# Patient Record
Sex: Male | Born: 1945 | Race: White | Hispanic: No | Marital: Married | Smoking: Never smoker
Health system: Southern US, Community
[De-identification: ages and names within clinical notes are randomized; demographics above are authoritative.]

## PROBLEM LIST (undated history)

## (undated) DIAGNOSIS — I480 Paroxysmal atrial fibrillation: Secondary | ICD-10-CM

## (undated) DIAGNOSIS — E875 Hyperkalemia: Secondary | ICD-10-CM

## (undated) DIAGNOSIS — I5042 Chronic combined systolic (congestive) and diastolic (congestive) heart failure: Secondary | ICD-10-CM

## (undated) DIAGNOSIS — E785 Hyperlipidemia, unspecified: Secondary | ICD-10-CM

## (undated) DIAGNOSIS — I10 Essential (primary) hypertension: Secondary | ICD-10-CM

## (undated) DIAGNOSIS — I700 Atherosclerosis of aorta: Secondary | ICD-10-CM

## (undated) DIAGNOSIS — I440 Atrioventricular block, first degree: Secondary | ICD-10-CM

## (undated) DIAGNOSIS — I499 Cardiac arrhythmia, unspecified: Secondary | ICD-10-CM

## (undated) DIAGNOSIS — I2510 Atherosclerotic heart disease of native coronary artery without angina pectoris: Secondary | ICD-10-CM

## (undated) HISTORY — DX: Hyperlipidemia, unspecified: E78.5

## (undated) HISTORY — DX: Atherosclerotic heart disease of native coronary artery without angina pectoris: I25.10

## (undated) HISTORY — DX: Atherosclerosis of aorta: I70.0

## (undated) HISTORY — DX: Paroxysmal atrial fibrillation: I48.0

## (undated) HISTORY — PX: SHOULDER SURGERY: SHX246

## (undated) HISTORY — DX: Hyperkalemia: E87.5

## (undated) HISTORY — DX: Chronic combined systolic (congestive) and diastolic (congestive) heart failure: I50.42

## (undated) HISTORY — DX: Atrioventricular block, first degree: I44.0

## (undated) HISTORY — DX: Essential (primary) hypertension: I10

---

## 2001-06-09 ENCOUNTER — Ambulatory Visit (HOSPITAL_COMMUNITY): Admission: RE | Admit: 2001-06-09 | Discharge: 2001-06-09 | Payer: Self-pay | Admitting: Internal Medicine

## 2001-06-12 ENCOUNTER — Encounter: Payer: Self-pay | Admitting: Internal Medicine

## 2001-06-12 ENCOUNTER — Ambulatory Visit (HOSPITAL_COMMUNITY): Admission: RE | Admit: 2001-06-12 | Discharge: 2001-06-12 | Payer: Self-pay | Admitting: Internal Medicine

## 2007-10-24 ENCOUNTER — Emergency Department (HOSPITAL_COMMUNITY): Admission: EM | Admit: 2007-10-24 | Discharge: 2007-10-24 | Payer: Self-pay | Admitting: Emergency Medicine

## 2011-03-06 LAB — DIFFERENTIAL
Basophils Absolute: 0
Basophils Relative: 0
Eosinophils Absolute: 0.1
Monocytes Absolute: 0.8
Monocytes Relative: 8
Neutro Abs: 8.8 — ABNORMAL HIGH

## 2011-03-06 LAB — TYPE AND SCREEN

## 2011-03-06 LAB — POCT I-STAT, CHEM 8
BUN: 20
Calcium, Ion: 0.99 — ABNORMAL LOW
Chloride: 109
Creatinine, Ser: 1
Glucose, Bld: 91
TCO2: 18

## 2011-03-06 LAB — CBC
Hemoglobin: 14.4
MCHC: 33.9
MCV: 89.4
RDW: 13.6

## 2011-03-06 LAB — ABO/RH: ABO/RH(D): A POS

## 2019-07-20 ENCOUNTER — Emergency Department (HOSPITAL_COMMUNITY)
Admission: EM | Admit: 2019-07-20 | Discharge: 2019-07-20 | Disposition: A | Discharge status: Home / Self Care | Payer: PRIVATE HEALTH INSURANCE | Attending: Emergency Medicine | Admitting: Emergency Medicine

## 2019-07-20 ENCOUNTER — Emergency Department (HOSPITAL_COMMUNITY): Payer: PRIVATE HEALTH INSURANCE

## 2019-07-20 ENCOUNTER — Encounter (HOSPITAL_COMMUNITY): Payer: Self-pay | Admitting: Emergency Medicine

## 2019-07-20 ENCOUNTER — Emergency Department (HOSPITAL_BASED_OUTPATIENT_CLINIC_OR_DEPARTMENT_OTHER): Payer: PRIVATE HEALTH INSURANCE

## 2019-07-20 ENCOUNTER — Other Ambulatory Visit: Payer: Self-pay

## 2019-07-20 DIAGNOSIS — R2242 Localized swelling, mass and lump, left lower limb: Secondary | ICD-10-CM | POA: Diagnosis not present

## 2019-07-20 DIAGNOSIS — I10 Essential (primary) hypertension: Secondary | ICD-10-CM

## 2019-07-20 DIAGNOSIS — I4891 Unspecified atrial fibrillation: Secondary | ICD-10-CM | POA: Insufficient documentation

## 2019-07-20 DIAGNOSIS — M7989 Other specified soft tissue disorders: Secondary | ICD-10-CM | POA: Diagnosis not present

## 2019-07-20 DIAGNOSIS — R000 Tachycardia, unspecified: Secondary | ICD-10-CM | POA: Diagnosis present

## 2019-07-20 LAB — BASIC METABOLIC PANEL
Anion gap: 10 (ref 5–15)
BUN: 16 mg/dL (ref 8–23)
CO2: 25 mmol/L (ref 22–32)
Calcium: 9.7 mg/dL (ref 8.9–10.3)
Chloride: 105 mmol/L (ref 98–111)
Creatinine, Ser: 0.94 mg/dL (ref 0.61–1.24)
GFR calc Af Amer: >60 mL/min (ref >60)
GFR calc non Af Amer: >60 mL/min (ref >60)
Glucose, Bld: 99 mg/dL (ref 70–99)
Potassium: 4.1 mmol/L (ref 3.5–5.1)
Sodium: 140 mmol/L (ref 135–145)

## 2019-07-20 LAB — CBC
HCT: 46.8 % (ref 39.0–52.0)
Hemoglobin: 15.3 g/dL (ref 13.0–17.0)
MCH: 30.8 pg (ref 26.0–34.0)
MCHC: 32.7 g/dL (ref 30.0–36.0)
MCV: 94.4 fL (ref 80.0–100.0)
Platelets: 130 10*3/uL — ABNORMAL LOW (ref 150–400)
RBC: 4.96 MIL/uL (ref 4.22–5.81)
RDW: 13.4 % (ref 11.5–15.5)
WBC: 6.7 10*3/uL (ref 4.0–10.5)
nRBC: 0 % (ref 0.0–0.2)

## 2019-07-20 LAB — TSH: TSH: 2.115 u[IU]/mL (ref 0.350–4.500)

## 2019-07-20 LAB — CBG MONITORING, ED: Glucose-Capillary: 89 mg/dL (ref 70–99)

## 2019-07-20 LAB — D-DIMER, QUANTITATIVE: D-Dimer, Quant: 1.09 ug/mL-FEU — ABNORMAL HIGH (ref 0.00–0.50)

## 2019-07-20 MED ORDER — METOPROLOL SUCCINATE ER 50 MG PO TB24: 50.0000 mg | ORAL_TABLET | Freq: Every day | ORAL | 0 refills | Status: DC

## 2019-07-20 MED ORDER — SODIUM CHLORIDE 0.9 % IV BOLUS
500.0000 mL | Freq: Once | INTRAVENOUS | Status: AC
  Administered 2019-07-20: 16:00:00 500 mL via INTRAVENOUS

## 2019-07-20 MED ORDER — APIXABAN 5 MG PO TABS
5.0000 mg | ORAL_TABLET | Freq: Once | ORAL | Status: AC
  Administered 2019-07-20: 20:00:00 5 mg via ORAL
  Filled 2019-07-20: qty 1

## 2019-07-20 MED ORDER — IOHEXOL 350 MG/ML SOLN
100.0000 mL | Freq: Once | INTRAVENOUS | Status: AC | PRN
  Administered 2019-07-20: 18:00:00 100 mL via INTRAVENOUS

## 2019-07-20 MED ORDER — METOPROLOL TARTRATE 25 MG PO TABS
25.0000 mg | ORAL_TABLET | Freq: Once | ORAL | Status: AC
  Administered 2019-07-20: 20:00:00 25 mg via ORAL
  Filled 2019-07-20: qty 1

## 2019-07-20 MED ORDER — APIXABAN 5 MG PO TABS: 5.0000 mg | ORAL_TABLET | Freq: Two times a day (BID) | ORAL | 0 refills | Status: DC

## 2019-07-20 NOTE — Discharge Instructions (Addendum)
Please begin taking Eliquis twice daily, this is a blood thinner to help reduce your risk of stroke with A. fib.  While taking this medication you are at an increased risk of bleeding.  Please read the instructions provided about this medication.  You were found to be in A. fib, this is the cause for your elevated heart rates, please begin taking metoprolol 50 mg once daily in the morning to help control your heart rates.  You will need to follow-up in the A. fib clinic in the next few weeks for reevaluation of your A. fib.  Please follow-up with your new primary care doctor as well for continued evaluation of your pressure.

## 2019-07-20 NOTE — Progress Notes (Addendum)
Left lower extremity venous duplex complete. Left lower extremity appears negative for DVT. Technical delay with study images from [place], preliminary report given to Jodi Geralds, [place] @ 17:45.  Levin Bacon- RDMS, RVT 6:13 PM  07/20/2019

## 2019-07-20 NOTE — ED Triage Notes (Signed)
Patient sent over from his primary care, first visit was today, primary care wanted him to be evaluated for his HTN, tachycardia and his blood sugar. Patent denies any complaints.

## 2019-07-20 NOTE — ED Notes (Signed)
Son of Gornick would like to be called with updates at [phone].

## 2019-07-20 NOTE — ED Provider Notes (Signed)
[facility]-EMERGENCY DEPT Provider Note   CSN: [id number] Arrival date & time: 07/20/19  1305     History Chief Complaint  Patient presents with  . Hypertension    Joshua Mejia is a 74 y.o. male.  Joshua Mejia is a 74 y.o. male with no known past medical history aside from back pain, who presents to the emergency department for evaluation of hypertension, tachycardia and possible diabetes.  Patient states that he has not seen a primary care doctor in about 30 years but a few months ago he went to an eye doctor and they noticed some spots in his eyes concerning for either hypertension or diabetes and referred him to a PCP.  He had his first appointment with a primary care doctor in [place] [facility] today and was sent to the ED for further evaluation of tachycardia, and hypertension.  He denied knowing that he had high blood pressure.  He has not had any chest pain or shortness of breath.  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  He denies any abdominal pain or swelling.  He did note some swelling in his left leg today while he was at the doctor's office but he reports this is not painful.  He denies any sensation of palpitations or heart racing.  He has not had any headaches, vision changes, numbness tingling or weakness.  Denies any fevers or recent illness.  He is unsure what his heart rate is usually.  Denies alcohol or substance abuse.        History reviewed. No pertinent past medical history.  There are no problems to display for this patient.   History reviewed. No pertinent surgical history.     No family history on file.  Social History   Tobacco Use  . Smoking status: Not on file  Substance Use Topics  . Alcohol use: Not on file  . Drug use: Not on file    Home Medications Prior to Admission medications   Not on File    Allergies    Patient has no allergy information on record.  Review  of Systems   Review of Systems  Constitutional: Negative for chills and fever.  HENT: Negative.   Eyes: Negative for visual disturbance.  Respiratory: Negative for cough and shortness of breath.   Cardiovascular: Positive for leg swelling. Negative for chest pain and palpitations.  Gastrointestinal: Negative for abdominal pain, nausea and vomiting.  Genitourinary: Negative for dysuria.  Musculoskeletal: Negative for arthralgias and myalgias.  Neurological: Negative for dizziness, syncope, weakness, light-headedness, numbness and headaches.  All other systems reviewed and are negative.   Physical Exam Updated Vital Signs BP (!) 172/101 (BP Location: Right Arm)   Pulse (!) 122   Temp 98.1 F (36.7 C) (Oral)   Resp 18   Ht 5\' 10"  (1.778 m)   Wt 102.5 kg   SpO2 98%   BMI 32.43 kg/m   Physical Exam Vitals and nursing note reviewed.  Constitutional:      General: He is not in acute distress.    Appearance: Normal appearance. He is well-developed and normal weight. He is not ill-appearing or diaphoretic.     Comments: Well-appearing elderly gentleman in no distress  HENT:     Head: Normocephalic and atraumatic.     Mouth/Throat:     Mouth: Mucous membranes are moist.     Pharynx: Oropharynx is clear.  Eyes:  General:        Right eye: No discharge.        Left eye: No discharge.     Extraocular Movements: Extraocular movements intact.     Pupils: Pupils are equal, round, and reactive to light.  Cardiovascular:     Rate and Rhythm: Tachycardia present. Rhythm irregular.     Heart sounds: Normal heart sounds. No murmur. No friction rub. No gallop.   Pulmonary:     Effort: Pulmonary effort is normal. No respiratory distress.     Breath sounds: Normal breath sounds. No wheezing or rales.     Comments: Respirations equal and unlabored, patient able to speak in full sentences, lungs clear to auscultation bilaterally Abdominal:     General: Bowel sounds are normal. There is  no distension.     Palpations: Abdomen is soft. There is no mass.     Tenderness: There is no abdominal tenderness. There is no guarding.     Comments: Abdomen soft, nondistended, nontender to palpation in all quadrants without guarding or peritoneal signs  Musculoskeletal:        General: No deformity.     Cervical back: Neck supple.     Comments: Trace edema noted in the left lower extremity, no calf tenderness noted  Skin:    General: Skin is warm and dry.     Capillary Refill: Capillary refill takes less than 2 seconds.  Neurological:     Mental Status: He is alert.     Coordination: Coordination normal.     Comments: Speech is clear, able to follow commands CN III-XII grossly intact Normal strength in upper and lower extremities bilaterally including dorsiflexion and plantar flexion, strong and equal grip strength Sensation normal to light and sharp touch Moves extremities without ataxia, coordination intact  Psychiatric:        Mood and Affect: Mood normal.        Behavior: Behavior normal.     ED Results / Procedures / Treatments   Labs (all labs ordered are listed, but only abnormal results are displayed) Labs Reviewed  CBC - Abnormal; Notable for the following components:      Result Value   Platelets 130 (*)    All other components within normal limits  D-DIMER, QUANTITATIVE (NOT AT [facility]) - Abnormal; Notable for the following components:   D-Dimer, Quant 1.09 (*)    All other components within normal limits  BASIC METABOLIC PANEL  TSH  CBG MONITORING, ED    EKG EKG Interpretation  Date/Time:  Tuesday July 20 2019 15:19:54 EST Ventricular Rate:  107 PR Interval:    QRS Duration: 106 QT Interval:  349 QTC Calculation: 466 R Axis:   -30 Text Interpretation: Atrial fibrillation Ventricular premature complex Abnormal R-wave progression, late transition Left ventricular hypertrophy Borderline T abnormalities, lateral leads Baseline wander in lead(s) V3 No   prior ECG for comparison. No STEMI Confirmed by Theda Belfast ([id number]) on 07/20/2019 5:08:49 PM   Radiology CT Angio Chest PE W and/or Wo Contrast  Result Date: 07/20/2019 CLINICAL DATA:  Hypertension, tachycardia EXAM: CT ANGIOGRAPHY CHEST WITH CONTRAST TECHNIQUE: Multidetector CT imaging of the chest was performed using the standard protocol during bolus administration of intravenous contrast. Multiplanar CT image reconstructions and MIPs were obtained to evaluate the vascular anatomy. CONTRAST:  OMNIPAQUE IOHEXOL 350 MG/ML SOLN COMPARISON:  10/24/2018 FINDINGS: Cardiovascular: This is a technically adequate evaluation of the pulmonary vasculature. There are no filling defects or pulmonary emboli. The heart  is enlarged without pericardial effusion. Prominent atherosclerosis is seen within the coronary vessels, greatest in the LAD distribution. There is mild atherosclerosis of the thoracic aorta. No evidence of aneurysm. Mediastinum/Nodes: There are borderline enlarged mediastinal and hilar lymph nodes. Largest lymph node in the right paratracheal region image 41 measures 16 mm in short axis. Lungs/Pleura: There is a trace right pleural effusion. No airspace disease or pneumothorax. The central airways are patent. Upper Abdomen: Hypodensity right lobe liver measures 2.2 cm reference image 107, likely a cyst. The remainder of the upper abdomen is unremarkable. Musculoskeletal: There are no acute displaced fractures. Subacute to chronic left posterior tenth and eleventh rib fractures are identified. Reconstructed images demonstrate no additional findings. Review of the MIP images confirms the above findings. IMPRESSION: 1. No evidence of pulmonary embolus. 2. Trace right pleural effusion. 3. Borderline enlarged mediastinal lymph nodes, nonspecific. 4. Cardiomegaly. 5. Aortic Atherosclerosis (ICD10-I70.0). Electronically Signed   By: Sharlet Salina M.D.   On: 07/20/2019 18:17    Procedures Procedures  (including critical care time)  Medications Ordered in ED Medications  sodium chloride 0.9 % bolus 500 mL (0 mLs Intravenous Stopped 07/20/19 1908)  iohexol (OMNIPAQUE) 350 MG/ML injection 100 mL (100 mLs Intravenous Contrast Given 07/20/19 1751)  apixaban (ELIQUIS) tablet 5 mg (5 mg Oral Given 07/20/19 2011)  metoprolol tartrate (LOPRESSOR) tablet 25 mg (25 mg Oral Given 07/20/19 2011)    ED Course  I have reviewed the triage vital signs and the nursing notes.  Pertinent labs & imaging results that were available during my care of the patient were reviewed by me and considered in my medical decision making (see chart for details).    MDM Rules/Calculators/A&P                     74 year old male arrives from new PCP appointment for evaluation of tachycardia and hypertension, patient also questions whether he may have diabetes due to recent abnormalities found on eye exam, but has not had blood sugar checked.  On arrival he is a heart rate of 122, hypertensive at 172/101, although appears to be asymptomatic.  He denies any chest pain, shortness of breath, palpitations, headaches, vision changes, numbness or weakness.  He does have trace edema of the left lower extremity which is nontender.  Will check basic labs, CBG, EKG, and given unexplained tachycardia will check D-dimer and TSH.  EKG from PCP office reviewed and shows tachycardia with rates in the 120s, multiple PVCs making it difficult to determine rhythm.  Lab work here shows no leukocytosis, normal hemoglobin, no electrolyte derangements and normal creatinine.  Normal TSH.  CBG is not elevated, no concern for diabetes today.  Patient's D-dimer is elevated at 1.09 will proceed with CTA of the test as well as left lower extremity DVT study.  In reviewing patient's EKG here it appears that patient may be in A. fib, telemetry monitoring looks like intermittent episodes of A. fib with heart rates into the 120s here.  Some improvement with 500 cc of  fluid but on reevaluation patient maintains tachycardic in the 110s.  Suspect new onset A. fib may be the cause of patient's tachycardia.  DVT study and PE study are negative.  Some cardiomegaly noted on chest CT.  No prior echo available.  It appears new onset A. fib is the most likely cause for patient's tachycardia, given that he is asymptomatic and well-appearing will discuss with cardiology for medication recommendations in hopes of discharging the  patient home, which would be his preference.  I discussed case with Dr. Marlou Porch with cardiology who agrees the patient is appropriate for discharge, he has a CHA2DS2-VASc score of 2, and will need anticoagulation in the setting of A. fib.  He has good kidney function, Dr. Marlou Porch recommends starting patient on Eliquis 5 mg twice daily, pharmacy will provide education for patient on this medication and he will be given his first dose tonight.  Dr. Marlou Porch also recommends starting him on 50 mg of metoprolol ER daily for control and this may have some benefit on the patient's blood pressure as well.  We will hold off on starting patient on any other blood pressure medications until we see how he tolerates the metoprolol.  Ambulatory referral placed for the A. fib clinic.  Dr. Marlou Porch recommends follow-up appointment in about 3 weeks to further discuss A. fib, may consider cardioversion.  We will also have patient follow closely with new PCP for further blood pressure management.  I discussed this plan with the patient and he is in agreement.  Gave instructions regarding new medications and discussed cautions with blood thinners.  Patient expressed stating in agreement with plan.  Given small dose of metoprolol and first dose of Eliquis here in the ED tonight.  Discharged home in good condition.   Final Clinical Impression(s) / ED Diagnoses Final diagnoses:  New onset atrial fibrillation ([place])  Hypertension, unspecified type    Rx / DC Orders ED Discharge  Orders         Ordered    Amb referral to AFIB Clinic     07/20/19 1913    apixaban (ELIQUIS) 5 MG TABS tablet  2 times daily     07/20/19 1919    metoprolol succinate (TOPROL XL) 50 MG 24 hr tablet  Daily     07/20/19 1919           Janet Berlin 07/20/19 2207    Tegeler, Gwenyth Allegra, MD 07/22/19 [phone]

## 2019-07-21 ENCOUNTER — Telehealth (HOSPITAL_COMMUNITY): Payer: Self-pay | Admitting: Nurse Practitioner

## 2019-07-21 NOTE — Telephone Encounter (Signed)
Referral from [facility] for pt to be seen in A-Fib clinic.  Called patient to schedule appt, no answer or VM.  Per Lupita Leash, pt needs to be scheduled for approx 2 wks so he can be scheduled for possible cardioversion in approx 3 wks.  Will try to call patient back.

## 2019-07-27 NOTE — Telephone Encounter (Signed)
Pt has appt 08/04/19 at A-Fib clinic

## 2019-08-04 ENCOUNTER — Ambulatory Visit (HOSPITAL_COMMUNITY)
Admission: RE | Admit: 2019-08-04 | Discharge: 2019-08-04 | Disposition: A | Discharge status: Home / Self Care | Payer: PRIVATE HEALTH INSURANCE | Source: Ambulatory Visit | Attending: Nurse Practitioner | Admitting: Nurse Practitioner

## 2019-08-04 ENCOUNTER — Other Ambulatory Visit: Payer: Self-pay

## 2019-08-04 VITALS — BP 166/86 | HR 119 | Ht 70.0 in | Wt 222.0 lb

## 2019-08-04 DIAGNOSIS — Z79899 Other long term (current) drug therapy: Secondary | ICD-10-CM | POA: Diagnosis not present

## 2019-08-04 DIAGNOSIS — D6869 Other thrombophilia: Secondary | ICD-10-CM

## 2019-08-04 DIAGNOSIS — Z7901 Long term (current) use of anticoagulants: Secondary | ICD-10-CM | POA: Diagnosis not present

## 2019-08-04 DIAGNOSIS — I10 Essential (primary) hypertension: Secondary | ICD-10-CM | POA: Diagnosis not present

## 2019-08-04 DIAGNOSIS — I4891 Unspecified atrial fibrillation: Secondary | ICD-10-CM | POA: Diagnosis not present

## 2019-08-04 DIAGNOSIS — I4819 Other persistent atrial fibrillation: Secondary | ICD-10-CM | POA: Diagnosis not present

## 2019-08-04 MED ORDER — METOPROLOL SUCCINATE ER 50 MG PO TB24: 50.0000 mg | ORAL_TABLET | Freq: Two times a day (BID) | ORAL | 1 refills | Status: DC

## 2019-08-04 NOTE — Patient Instructions (Signed)
Increase metoprolol to 50mg twice a day 

## 2019-08-04 NOTE — Progress Notes (Addendum)
Primary Care Physician: Christa See, FNP Referring Physician: [facility] ER f/u    Joshua Mejia is a 74 y.o. male with nl known medical history but had not been see by a MD in 72 years in f/u in afib clinic for newly dx afib. Initially  was seen by opthalmologic physician and noted some  spots in his eyes concerning for HTN or DM. He was advised to get established with a PCP. At that visit, he was found to have tachycardia from  which pt was asymptomatic and was sent to the ER. The  EKG confirmed afib. ER MD spoke to Dr. Gillian Shields who suggested BB/DOAC and f/u here.CHA2DS2VASc score of 2.  In the clinic today, pt has been faithful to take eliquis 5 mg bid, stats no missed doses . Has been on since 07/20/19. He continues with RVR despite 50 mg metoprolol qd. He  is still  asymptomatic. Denies any exertional dyspnea, fatigue or fluid weight gain. There  is no way  to determine onset of afib.  He did  have asymptomatic Covid-19 in August. 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   He lives alone. Denies snoring. 2 cups of coffee this am. Drinks 2 glasses of wine or 2 beers every night. Does not smoke.  Today, he denies symptoms of palpitations, chest pain, shortness of breath, orthopnea, PND, lower extremity edema, dizziness, presyncope, syncope, or neurologic sequela. The patient is tolerating medications without difficulties and is otherwise without complaint today.   No past medical history on file. No past surgical history on file.  Current Outpatient Medications  Medication Sig Dispense Refill  . apixaban (ELIQUIS) 5 MG TABS tablet Take 1 tablet (5 mg total) by mouth 2 (two) times daily. 60 tablet 0  . metoprolol succinate (TOPROL XL) 50 MG 24 hr tablet Take 1 tablet (50 mg total) by mouth daily. Take with or immediately following a meal. 30 tablet 0   No current facility-administered medications for this encounter.    Not on File  Social History   Socioeconomic History  .  Marital status: Married    Spouse name: Not on file  . Number of children: Not on file  . Years of education: Not on file  . Highest education level: Not on file  Occupational History  . Not on file  Tobacco Use  . Smoking status: Not on file  Substance and Sexual Activity  . Alcohol use: Not on file  . Drug use: Not on file  . Sexual activity: Not on file  Other Topics Concern  . Not on file  Social History Narrative  . Not on file   Social Determinants of Health   Financial Resource Strain:   . Difficulty of Paying Living Expenses: Not on file  Food Insecurity:   . Worried About Charity fundraiser in the Last Year: Not on file  . Ran Out of Food in the Last Year: Not on file  Transportation Needs:   . Lack of Transportation (Medical): Not on file  . Lack of Transportation (Non-Medical): Not on file  Physical Activity:   . Days of Exercise per Week: Not on file  . Minutes of Exercise per Session: Not on file  Stress:   . Feeling of Stress : Not on file  Social Connections:   . Frequency of Communication with Friends and Family: Not on file  . Frequency of Social Gatherings with Friends and Family: Not on file  .  Attends Religious Services: Not on file  . Active Member of Clubs or Organizations: Not on file  . Attends Banker Meetings: Not on file  . Marital Status: Not on file  Intimate Partner Violence:   . Fear of Current or Ex-Partner: Not on file  . Emotionally Abused: Not on file  . Physically Abused: Not on file  . Sexually Abused: Not on file    No family history on file.  ROS- All systems are reviewed and negative except as per the HPI above  Physical Exam: Vitals:   08/04/19 0834  BP: (!) 166/86  Pulse: (!) 119  Weight: 100.7 kg  Height: 5\' 10"  (1.778 m)   Wt Readings from Last 3 Encounters:  08/04/19 100.7 kg  07/20/19 102.5 kg    Labs: Lab Results  Component Value Date   NA 140 07/20/2019   K 4.1 07/20/2019   CL 105  07/20/2019   CO2 25 07/20/2019   GLUCOSE 99 07/20/2019   BUN 16 07/20/2019   CREATININE 0.94 07/20/2019   CALCIUM 9.7 07/20/2019   No results found for: INR No results found for: CHOL, HDL, LDLCALC, TRIG   GEN- The patient is well appearing, alert and oriented x 3 today.   Head- normocephalic, atraumatic Eyes-  Sclera clear, conjunctiva pink Ears- hearing intact Oropharynx- clear Neck- supple, no JVP Lymph- no cervical lymphadenopathy Lungs- Clear to ausculation bilaterally, normal work of breathing Heart- irregular rate and rhythm, no murmurs, rubs or gallops, PMI not laterally displaced GI- soft, NT, ND, + BS Extremities- no clubbing, cyanosis, or edema MS- no significant deformity or atrophy Skin- no rash or lesion Psych- euthymic mood, full affect Neuro- strength and sensation are intact  EKG-afib at 119 bpm,     Assessment and Plan: 1. Newly dx afib Onset of afib unknown Asymptomatic  General education re afib Increase metoprolol ER to 50 mg bid Recommend no more than 2 alcoholic drinks a week   2. HTN Elevated  Extra metoprolol hopefully will help  PCP is also following   3. CHA2DS2VASc of 2 Continue eliquis 5 mg bid without missed doses   Will see back in one week for nurse visit with ekg and BP  to see if better rate controlled If so will schedule  Echo Will also get baseline fasting  liver/lipid as Dr. 09/17/2019 also suggested to start cholesterol med   Anticipate setting up for cardioversion after 3 weeks on anticoagulation and later sending to Dr. Anne Fu to get establsihed   Anne Fu C. Lupita Leash [facility] [address] [place], [place] [phone]

## 2019-08-11 ENCOUNTER — Other Ambulatory Visit: Payer: Self-pay

## 2019-08-11 ENCOUNTER — Ambulatory Visit (HOSPITAL_COMMUNITY)
Admission: RE | Admit: 2019-08-11 | Discharge: 2019-08-11 | Disposition: A | Discharge status: Home / Self Care | Payer: PRIVATE HEALTH INSURANCE | Source: Ambulatory Visit | Attending: Nurse Practitioner | Admitting: Nurse Practitioner

## 2019-08-11 DIAGNOSIS — Z8616 Personal history of COVID-19: Secondary | ICD-10-CM | POA: Diagnosis not present

## 2019-08-11 DIAGNOSIS — I10 Essential (primary) hypertension: Secondary | ICD-10-CM | POA: Insufficient documentation

## 2019-08-11 DIAGNOSIS — Z79899 Other long term (current) drug therapy: Secondary | ICD-10-CM | POA: Diagnosis not present

## 2019-08-11 DIAGNOSIS — Z7901 Long term (current) use of anticoagulants: Secondary | ICD-10-CM | POA: Insufficient documentation

## 2019-08-11 DIAGNOSIS — I4891 Unspecified atrial fibrillation: Secondary | ICD-10-CM | POA: Diagnosis present

## 2019-08-11 LAB — CBC
HCT: 47.4 % (ref 39.0–52.0)
Hemoglobin: 15.8 g/dL (ref 13.0–17.0)
MCH: 31 pg (ref 26.0–34.0)
MCHC: 33.3 g/dL (ref 30.0–36.0)
MCV: 93.1 fL (ref 80.0–100.0)
Platelets: 180 10*3/uL (ref 150–400)
RBC: 5.09 MIL/uL (ref 4.22–5.81)
RDW: 12.8 % (ref 11.5–15.5)
WBC: 7 10*3/uL (ref 4.0–10.5)
nRBC: 0 % (ref 0.0–0.2)

## 2019-08-11 LAB — COMPREHENSIVE METABOLIC PANEL
ALT: 27 U/L (ref 0–44)
AST: 26 U/L (ref 15–41)
Albumin: 4 g/dL (ref 3.5–5.0)
Alkaline Phosphatase: 63 U/L (ref 38–126)
Anion gap: 9 (ref 5–15)
BUN: 25 mg/dL — ABNORMAL HIGH (ref 8–23)
CO2: 25 mmol/L (ref 22–32)
Calcium: 9.9 mg/dL (ref 8.9–10.3)
Chloride: 104 mmol/L (ref 98–111)
Creatinine, Ser: 1.01 mg/dL (ref 0.61–1.24)
GFR calc Af Amer: >60 mL/min (ref >60)
GFR calc non Af Amer: >60 mL/min (ref >60)
Glucose, Bld: 111 mg/dL — ABNORMAL HIGH (ref 70–99)
Potassium: 5.8 mmol/L — ABNORMAL HIGH (ref 3.5–5.1)
Sodium: 138 mmol/L (ref 135–145)
Total Bilirubin: 1.2 mg/dL (ref 0.3–1.2)
Total Protein: 7.4 g/dL (ref 6.5–8.1)

## 2019-08-11 LAB — LIPID PANEL
Cholesterol: 214 mg/dL — ABNORMAL HIGH (ref 0–200)
HDL: 61 mg/dL (ref >40)
LDL Cholesterol: 145 mg/dL — ABNORMAL HIGH (ref 0–99)
Total CHOL/HDL Ratio: 3.5 RATIO
Triglycerides: 38 mg/dL (ref <150)
VLDL: 8 mg/dL (ref 0–40)

## 2019-08-11 NOTE — Progress Notes (Signed)
Primary Care Physician: Christa See, FNP Referring Physician: [facility] ER f/u    Joshua Mejia is a 74 y.o. male with nl known medical history but had not been see by a MD in 33 years in f/u in afib clinic for newly dx afib. Initially  was seen by opthalmologic physician and noted some  spots in his eyes concerning for HTN or DM. He was advised to get established with a PCP. At that visit, he was found to have tachycardia from  which pt was asymptomatic and was sent to the ER. The  EKG confirmed afib. ER MD spoke to Dr. Gillian Shields who suggested BB/DOAC and f/u here.CHA2DS2VASc score of 2.  In the clinic today, pt has been faithful to take eliquis 5 mg bid, states no missed doses . Has been on since 07/20/19. He continues with RVR despite 50 mg metoprolol qd. He  is still  asymptomatic. Denies any exertional dyspnea, fatigue or fluid weight gain. There  is no way  to determine onset of afib.  He did  have asymptomatic Covid-19 in August. 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   He lives alone. Denies snoring. 2 cups of coffee this am. Drinks 2 glasses of wine or 2 beers every night. Does not smoke.  F/u in afib clinic, 08/11/19. He has not missed any doses of anticoagulation and has been on  sufficient amount of time to scheduled for cardioversion. Increase of BB on last visit helped rate control and BP is improved but not optimal.   Today, he denies symptoms of palpitations, chest pain, shortness of breath, orthopnea, PND, lower extremity edema, dizziness, presyncope, syncope, or neurologic sequela. The patient is tolerating medications without difficulties and is otherwise without complaint today.   No past medical history on file. No past surgical history on file.  Current Outpatient Medications  Medication Sig Dispense Refill  . apixaban (ELIQUIS) 5 MG TABS tablet Take 1 tablet (5 mg total) by mouth 2 (two) times daily. 60 tablet 0  . metoprolol succinate (TOPROL XL) 50 MG 24 hr  tablet Take 1 tablet (50 mg total) by mouth 2 (two) times daily. Take with or immediately following a meal. 60 tablet 1   No current facility-administered medications for this encounter.    Not on File  Social History   Socioeconomic History  . Marital status: Married    Spouse name: Not on file  . Number of children: Not on file  . Years of education: Not on file  . Highest education level: Not on file  Occupational History  . Not on file  Tobacco Use  . Smoking status: Not on file  Substance and Sexual Activity  . Alcohol use: Not on file  . Drug use: Not on file  . Sexual activity: Not on file  Other Topics Concern  . Not on file  Social History Narrative  . Not on file   Social Determinants of Health   Financial Resource Strain:   . Difficulty of Paying Living Expenses: Not on file  Food Insecurity:   . Worried About Charity fundraiser in the Last Year: Not on file  . Ran Out of Food in the Last Year: Not on file  Transportation Needs:   . Lack of Transportation (Medical): Not on file  . Lack of Transportation (Non-Medical): Not on file  Physical Activity:   . Days of Exercise per Week: Not on file  . Minutes of  Exercise per Session: Not on file  Stress:   . Feeling of Stress : Not on file  Social Connections:   . Frequency of Communication with Friends and Family: Not on file  . Frequency of Social Gatherings with Friends and Family: Not on file  . Attends Religious Services: Not on file  . Active Member of Clubs or Organizations: Not on file  . Attends Banker Meetings: Not on file  . Marital Status: Not on file  Intimate Partner Violence:   . Fear of Current or Ex-Partner: Not on file  . Emotionally Abused: Not on file  . Physically Abused: Not on file  . Sexually Abused: Not on file    No family history on file.  ROS- All systems are reviewed and negative except as per the HPI above  Physical Exam: Vitals:   08/11/19 0907  BP: (!)  158/74  Pulse: 98   Wt Readings from Last 3 Encounters:  08/04/19 100.7 kg  07/20/19 102.5 kg    Labs: Lab Results  Component Value Date   NA 140 07/20/2019   K 4.1 07/20/2019   CL 105 07/20/2019   CO2 25 07/20/2019   GLUCOSE 99 07/20/2019   BUN 16 07/20/2019   CREATININE 0.94 07/20/2019   CALCIUM 9.7 07/20/2019   No results found for: INR No results found for: CHOL, HDL, LDLCALC, TRIG   GEN- The patient is well appearing, alert and oriented x 3 today.   Head- normocephalic, atraumatic Eyes-  Sclera clear, conjunctiva pink Ears- hearing intact Oropharynx- clear Neck- supple, no JVP Lymph- no cervical lymphadenopathy Lungs- Clear to ausculation bilaterally, normal work of breathing Heart- irregular rate and rhythm, no murmurs, rubs or gallops, PMI not laterally displaced GI- soft, NT, ND, + BS Extremities- no clubbing, cyanosis, or edema MS- no significant deformity or atrophy Skin- no rash or lesion Psych- euthymic mood, full affect Neuro- strength and sensation are intact  EKG-afib at 98 bpm, qrs int 104 ms, qtc 434 ms   Assessment and Plan: 1. Newly dx afib Onset of afib unknown Asymptomatic  Will set up for cardioversion, on anticoagulation since 2/9 Continue  metoprolol ER  50 mg bid Recommend no more than 2 alcoholic drinks a week  Fasting cmet/cbc/lipid panel today in anticipation of starting statin per Dr. Anne Fu reecommendation  2. HTN Improved but not optimal For now continue BB bid   3. CHA2DS2VASc of 2 Continue eliquis 5 mg bid, adamant no missed doses   Will se back in one week after cardioversion and then order echo hopefully  in SR  Kelsee Preslar C[facility] [address] [place], [place] [address] [phone]

## 2019-08-11 NOTE — H&P (View-Only) (Signed)
 Primary Care Physician: Yonjof, Mary, FNP Referring Physician: [facility] ER f/u    Joshua Mejia is a 74 y.o. male with nl known medical history but had not been see by a MD in 30 years in f/u in afib clinic for newly dx afib. Initially  was seen by opthalmologic physician and noted some  spots in his eyes concerning for HTN or DM. He was advised to get established with a PCP. At that visit, he was found to have tachycardia from  which pt was asymptomatic and was sent to the ER. The  EKG confirmed afib. ER MD spoke to Dr. Skain who suggested BB/DOAC and f/u here.CHA2DS2VASc score of 2.  In the clinic today, pt has been faithful to take eliquis 5 mg bid, states no missed doses . Has been on since 07/20/19. He continues with RVR despite 50 mg metoprolol qd. He  is still  asymptomatic. Denies any exertional dyspnea, fatigue or fluid weight gain. There  is no way  to determine onset of afib.  He did  have asymptomatic Covid-19 in August. 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   He lives alone. Denies snoring. 2 cups of coffee this am. Drinks 2 glasses of wine or 2 beers every night. Does not smoke.  F/u in afib clinic, 08/11/19. He has not missed any doses of anticoagulation and has been on  sufficient amount of time to scheduled for cardioversion. Increase of BB on last visit helped rate control and BP is improved but not optimal.   Today, he denies symptoms of palpitations, chest pain, shortness of breath, orthopnea, PND, lower extremity edema, dizziness, presyncope, syncope, or neurologic sequela. The patient is tolerating medications without difficulties and is otherwise without complaint today.   No past medical history on file. No past surgical history on file.  Current Outpatient Medications  Medication Sig Dispense Refill  . apixaban (ELIQUIS) 5 MG TABS tablet Take 1 tablet (5 mg total) by mouth 2 (two) times daily. 60 tablet 0  . metoprolol succinate (TOPROL XL) 50 MG 24 hr  tablet Take 1 tablet (50 mg total) by mouth 2 (two) times daily. Take with or immediately following a meal. 60 tablet 1   No current facility-administered medications for this encounter.    Not on File  Social History   Socioeconomic History  . Marital status: Married    Spouse name: Not on file  . Number of children: Not on file  . Years of education: Not on file  . Highest education level: Not on file  Occupational History  . Not on file  Tobacco Use  . Smoking status: Not on file  Substance and Sexual Activity  . Alcohol use: Not on file  . Drug use: Not on file  . Sexual activity: Not on file  Other Topics Concern  . Not on file  Social History Narrative  . Not on file   Social Determinants of Health   Financial Resource Strain:   . Difficulty of Paying Living Expenses: Not on file  Food Insecurity:   . Worried About Running Out of Food in the Last Year: Not on file  . Ran Out of Food in the Last Year: Not on file  Transportation Needs:   . Lack of Transportation (Medical): Not on file  . Lack of Transportation (Non-Medical): Not on file  Physical Activity:   . Days of Exercise per Week: Not on file  . Minutes of   Exercise per Session: Not on file  Stress:   . Feeling of Stress : Not on file  Social Connections:   . Frequency of Communication with Friends and Family: Not on file  . Frequency of Social Gatherings with Friends and Family: Not on file  . Attends Religious Services: Not on file  . Active Member of Clubs or Organizations: Not on file  . Attends Banker Meetings: Not on file  . Marital Status: Not on file  Intimate Partner Violence:   . Fear of Current or Ex-Partner: Not on file  . Emotionally Abused: Not on file  . Physically Abused: Not on file  . Sexually Abused: Not on file    No family history on file.  ROS- All systems are reviewed and negative except as per the HPI above  Physical Exam: Vitals:   08/11/19 0907  BP: (!)  158/74  Pulse: 98   Wt Readings from Last 3 Encounters:  08/04/19 100.7 kg  07/20/19 102.5 kg    Labs: Lab Results  Component Value Date   NA 140 07/20/2019   K 4.1 07/20/2019   CL 105 07/20/2019   CO2 25 07/20/2019   GLUCOSE 99 07/20/2019   BUN 16 07/20/2019   CREATININE 0.94 07/20/2019   CALCIUM 9.7 07/20/2019   No results found for: INR No results found for: CHOL, HDL, LDLCALC, TRIG   GEN- The patient is well appearing, alert and oriented x 3 today.   Head- normocephalic, atraumatic Eyes-  Sclera clear, conjunctiva pink Ears- hearing intact Oropharynx- clear Neck- supple, no JVP Lymph- no cervical lymphadenopathy Lungs- Clear to ausculation bilaterally, normal work of breathing Heart- irregular rate and rhythm, no murmurs, rubs or gallops, PMI not laterally displaced GI- soft, NT, ND, + BS Extremities- no clubbing, cyanosis, or edema MS- no significant deformity or atrophy Skin- no rash or lesion Psych- euthymic mood, full affect Neuro- strength and sensation are intact  EKG-afib at 98 bpm, qrs int 104 ms, qtc 434 ms   Assessment and Plan: 1. Newly dx afib Onset of afib unknown Asymptomatic  Will set up for cardioversion, on anticoagulation since 2/9 Continue  metoprolol ER  50 mg bid Recommend no more than 2 alcoholic drinks a week  Fasting cmet/cbc/lipid panel today in anticipation of starting statin per Dr. Anne Fu reecommendation  2. HTN Improved but not optimal For now continue BB bid   3. CHA2DS2VASc of 2 Continue eliquis 5 mg bid, adamant no missed doses   Will se back in one week after cardioversion and then order echo hopefully  in [place]  Avriel Kandel C[facility] [address] [place], [place] [address] [phone]

## 2019-08-11 NOTE — Patient Instructions (Signed)
Cardioversion scheduled for Thursday, March 11th  - Arrive at the [facility] and go to admitting at [facility] not eat or drink anything after midnight the night prior to your procedure.  - Take all your morning medication with a sip of water prior to arrival.  - You will not be able to drive home after your procedure.

## 2019-08-13 ENCOUNTER — Other Ambulatory Visit (HOSPITAL_COMMUNITY): Payer: Self-pay | Admitting: *Deleted

## 2019-08-13 MED ORDER — ROSUVASTATIN CALCIUM 10 MG PO TABS: 10.0000 mg | ORAL_TABLET | Freq: Every day | ORAL | 6 refills | Status: DC

## 2019-08-17 ENCOUNTER — Other Ambulatory Visit (HOSPITAL_COMMUNITY)
Admission: RE | Admit: 2019-08-17 | Discharge: 2019-08-17 | Disposition: A | Discharge status: Home / Self Care | Payer: PRIVATE HEALTH INSURANCE | Source: Ambulatory Visit | Attending: Cardiovascular Disease | Admitting: Cardiovascular Disease

## 2019-08-17 DIAGNOSIS — Z20822 Contact with and (suspected) exposure to covid-19: Secondary | ICD-10-CM | POA: Insufficient documentation

## 2019-08-17 DIAGNOSIS — Z01812 Encounter for preprocedural laboratory examination: Secondary | ICD-10-CM | POA: Diagnosis present

## 2019-08-17 LAB — SARS CORONAVIRUS 2 (TAT 6-24 HRS): SARS Coronavirus 2: NEGATIVE

## 2019-08-19 ENCOUNTER — Encounter (HOSPITAL_COMMUNITY): Payer: Self-pay | Admitting: Cardiovascular Disease

## 2019-08-19 ENCOUNTER — Other Ambulatory Visit: Payer: Self-pay

## 2019-08-19 ENCOUNTER — Encounter (HOSPITAL_COMMUNITY)
Admission: RE | Disposition: A | Discharge status: Home / Self Care | Payer: Self-pay | Source: Home / Self Care | Attending: Cardiovascular Disease

## 2019-08-19 ENCOUNTER — Ambulatory Visit (HOSPITAL_COMMUNITY)
Admission: RE | Admit: 2019-08-19 | Discharge: 2019-08-19 | Disposition: A | Discharge status: Home / Self Care | Payer: PRIVATE HEALTH INSURANCE | Attending: Cardiovascular Disease | Admitting: Cardiovascular Disease

## 2019-08-19 ENCOUNTER — Ambulatory Visit (HOSPITAL_COMMUNITY): Payer: PRIVATE HEALTH INSURANCE | Admitting: Certified Registered"

## 2019-08-19 DIAGNOSIS — Z79899 Other long term (current) drug therapy: Secondary | ICD-10-CM | POA: Insufficient documentation

## 2019-08-19 DIAGNOSIS — I4891 Unspecified atrial fibrillation: Secondary | ICD-10-CM

## 2019-08-19 DIAGNOSIS — Z7901 Long term (current) use of anticoagulants: Secondary | ICD-10-CM | POA: Diagnosis not present

## 2019-08-19 DIAGNOSIS — I10 Essential (primary) hypertension: Secondary | ICD-10-CM | POA: Insufficient documentation

## 2019-08-19 DIAGNOSIS — Z8616 Personal history of COVID-19: Secondary | ICD-10-CM | POA: Diagnosis not present

## 2019-08-19 HISTORY — PX: CARDIOVERSION: SHX1299

## 2019-08-19 HISTORY — DX: Cardiac arrhythmia, unspecified: I49.9

## 2019-08-19 LAB — POCT I-STAT, CHEM 8
BUN: 26 mg/dL — ABNORMAL HIGH (ref 8–23)
Calcium, Ion: 1.21 mmol/L (ref 1.15–1.40)
Chloride: 106 mmol/L (ref 98–111)
Creatinine, Ser: 0.9 mg/dL (ref 0.61–1.24)
Glucose, Bld: 95 mg/dL (ref 70–99)
HCT: 47 % (ref 39.0–52.0)
Hemoglobin: 16 g/dL (ref 13.0–17.0)
Potassium: 4.9 mmol/L (ref 3.5–5.1)
Sodium: 141 mmol/L (ref 135–145)
TCO2: 30 mmol/L (ref 22–32)

## 2019-08-19 SURGERY — CARDIOVERSION
Anesthesia: General

## 2019-08-19 MED ORDER — PROPOFOL 10 MG/ML IV BOLUS
INTRAVENOUS | Status: DC | PRN
  Administered 2019-08-19: 80 mg via INTRAVENOUS

## 2019-08-19 MED ORDER — LIDOCAINE 2% (20 MG/ML) 5 ML SYRINGE
INTRAMUSCULAR | Status: DC | PRN
  Administered 2019-08-19: 60 mg via INTRAVENOUS

## 2019-08-19 MED ORDER — SODIUM CHLORIDE 0.9 % IV SOLN: INTRAVENOUS | Status: DC | PRN

## 2019-08-19 NOTE — Discharge Instructions (Signed)
Electrical Cardioversion Electrical cardioversion is the delivery of a jolt of electricity to restore a normal rhythm to the heart. A rhythm that is too fast or is not regular keeps the heart from pumping well. In this procedure, sticky patches or metal paddles are placed on the chest to deliver electricity to the heart from a device. This procedure may be done in an emergency if:  There is low or no blood pressure as a result of the heart rhythm.  Normal rhythm must be restored as fast as possible to protect the brain and heart from further damage.  It may save a life. This may also be a scheduled procedure for irregular or fast heart rhythms that are not immediately life-threatening. Tell a health care provider about:  Any allergies you have.  All medicines you are taking, including vitamins, herbs, eye drops, creams, and over-the-counter medicines.  Any problems you or family members have had with anesthetic medicines.  Any blood disorders you have.  Any surgeries you have had.  Any medical conditions you have.  Whether you are pregnant or may be pregnant. What are the risks? Generally, this is a safe procedure. However, problems may occur, including:  Allergic reactions to medicines.  A blood clot that breaks free and travels to other parts of your body.  The possible return of an abnormal heart rhythm within hours or days after the procedure.  Your heart stopping (cardiac arrest). This is rare. What happens before the procedure? Medicines  Your health care provider may have you start taking: ? Blood-thinning medicines (anticoagulants) so your blood does not clot as easily. ? Medicines to help stabilize your heart rate and rhythm.  Ask your health care provider about: ? Changing or stopping your regular medicines. This is especially important if you are taking diabetes medicines or blood thinners. ? Taking medicines such as aspirin and ibuprofen. These medicines can  thin your blood. Do not take these medicines unless your health care provider tells you to take them. ? Taking over-the-counter medicines, vitamins, herbs, and supplements. General instructions  Follow instructions from your health care provider about eating or drinking restrictions.  Plan to have someone take you home from the hospital or clinic.  If you will be going home right after the procedure, plan to have someone with you for 24 hours.  Ask your health care provider what steps will be taken to help prevent infection. These may include washing your skin with a germ-killing soap. What happens during the procedure?   An IV will be inserted into one of your veins.  Sticky patches (electrodes) or metal paddles may be placed on your chest.  You will be given a medicine to help you relax (sedative).  An electrical shock will be delivered. The procedure may vary among health care providers and hospitals. What can I expect after the procedure?  Your blood pressure, heart rate, breathing rate, and blood oxygen level will be monitored until you leave the hospital or clinic.  Your heart rhythm will be watched to make sure it does not change.  You may have some redness on the skin where the shocks were given. Follow these instructions at home:  Do not drive for 24 hours if you were given a sedative during your procedure.  Take over-the-counter and prescription medicines only as told by your health care provider.  Ask your health care provider how to check your pulse. Check it often.  Rest for 48 hours after the procedure or   as told by your health care provider.  Avoid or limit your caffeine use as told by your health care provider.  Keep all follow-up visits as told by your health care provider. This is important. Contact a health care provider if:  You feel like your heart is beating too quickly or your pulse is not regular.  You have a serious muscle cramp that does not go  away. Get help right away if:  You have discomfort in your chest.  You are dizzy or you feel faint.  You have trouble breathing or you are short of breath.  Your speech is slurred.  You have trouble moving an arm or leg on one side of your body.  Your fingers or toes turn cold or blue. Summary  Electrical cardioversion is the delivery of a jolt of electricity to restore a normal rhythm to the heart.  This procedure may be done right away in an emergency or may be a scheduled procedure if the condition is not an emergency.  Generally, this is a safe procedure.  After the procedure, check your pulse often as told by your health care provider. This information is not intended to replace advice given to you by your health care provider. Make sure you discuss any questions you have with your health care provider. Document Revised: 12/28/2018 Document Reviewed: 12/28/2018 Elsevier Patient Education  2020 [employer]  

## 2019-08-19 NOTE — Anesthesia Procedure Notes (Signed)
Procedure Name: General with mask airway Date/Time: 08/19/2019 9:39 AM Performed by: Elliot Dally, CRNA Pre-anesthesia Checklist: Patient being monitored, Patient identified, Emergency Drugs available, Suction available and Timeout performed Patient Re-evaluated:Patient Re-evaluated prior to induction Oxygen Delivery Method: Ambu bag

## 2019-08-19 NOTE — Interval H&P Note (Signed)
History and Physical Interval Note:  08/19/2019 9:35 AM  Joshua Mejia  has presented today for surgery, with the diagnosis of AFIB.  The various methods of treatment have been discussed with the patient and family. After consideration of risks, benefits and other options for treatment, the patient has consented to  Procedure(s): CARDIOVERSION (N/A) as a surgical intervention.  The patient's history has been reviewed, patient examined, no change in status, stable for surgery.  I have reviewed the patient's chart and labs.  Questions were answered to the patient's satisfaction.    Symptomatic Afib. Outpatient labs show potassium 5.8. Will repeat and then proceed if normal. On eliquis for >3 weeks and no missed doses.   Gerri Spore T. Flora Lipps, MD [facility]  [address], [address] [place], [place] [address] [phone]  9:36 AM

## 2019-08-19 NOTE — Anesthesia Preprocedure Evaluation (Signed)
Anesthesia Evaluation  Patient identified by MRN, date of birth, ID band Patient awake    Reviewed: Allergy & Precautions, NPO status , Patient's Chart, lab work & pertinent test results  Airway Mallampati: II  TM Distance: >3 FB Neck ROM: Full    Dental no notable dental hx. (+) Teeth Intact, Dental Advisory Given   Pulmonary    Pulmonary exam normal breath sounds clear to auscultation       Cardiovascular Normal cardiovascular exam+ dysrhythmias Atrial Fibrillation  Rhythm:Irregular Rate:Abnormal     Neuro/Psych    GI/Hepatic Neg liver ROS,   Endo/Other  negative endocrine ROS  Renal/GU negative Renal ROS     Musculoskeletal   Abdominal   Peds  Hematology negative hematology ROS (+)   Anesthesia Other Findings   Reproductive/Obstetrics                             Anesthesia Physical Anesthesia Plan  ASA: II  Anesthesia Plan: General   Post-op Pain Management:    Induction: Intravenous  PONV Risk Score and Plan: Treatment may vary due to age or medical condition  Airway Management Planned: Nasal Cannula and Natural Airway  Additional Equipment:   Intra-op Plan:   Post-operative Plan:   Informed Consent: I have reviewed the patients History and Physical, chart, labs and discussed the procedure including the risks, benefits and alternatives for the proposed anesthesia with the patient or authorized representative who has indicated his/her understanding and acceptance.     Dental advisory given  Plan Discussed with:   Anesthesia Plan Comments:         Anesthesia Quick Evaluation

## 2019-08-19 NOTE — Transfer of Care (Signed)
Immediate Anesthesia Transfer of Care Note  Patient: Joshua Mejia  Procedure(s) Performed: CARDIOVERSION (N/A )  Patient Location: Endoscopy Unit  Anesthesia Type:General  Level of Consciousness: drowsy  Airway & Oxygen Therapy: Patient Spontanous Breathing  Post-op Assessment: Report given to RN and Post -op Vital signs reviewed and stable  Post vital signs: Reviewed and stable  Last Vitals:  Vitals Value Taken Time  BP    Temp    Pulse    Resp    SpO2      Last Pain:  Vitals:   08/19/19 0912  PainSc: 0-No pain         Complications: No apparent anesthesia complications

## 2019-08-19 NOTE — CV Procedure (Signed)
   DIRECT CURRENT CARDIOVERSION  NAME:  Joshua Mejia    MRN: [id number] DOB:  Oct 23, 1945    ADMIT DATE: 08/19/2019  Indication:  Symptomatic atrial fibrillation  Procedure Note:  The patient signed informed consent.  They have had had therapeutic anticoagulation with eliquis greater than 3 weeks.  Anesthesia was administered by Dr. Richardson Landry.  Adequate airway was maintained throughout and vital followed per protocol.  They were cardioverted x 1 with 200J of biphasic synchronized energy.  They converted to NSR.  There were no apparent complications.  The patient had normal neuro status and respiratory status post procedure with vitals stable as recorded elsewhere.    Follow up: They will continue on current medical therapy and follow up with cardiology as scheduled.  Gerri Spore T. Flora Lipps, MD [facility]  [address], [address] [place], [place] [address] [phone]  9:51 AM

## 2019-08-19 NOTE — Anesthesia Postprocedure Evaluation (Signed)
Anesthesia Post Note  Patient: Joshua Mejia  Procedure(s) Performed: CARDIOVERSION (N/A )     Patient location during evaluation: Endoscopy Anesthesia Type: General Level of consciousness: awake and alert Pain management: pain level controlled Vital Signs Assessment: post-procedure vital signs reviewed and stable Respiratory status: spontaneous breathing, nonlabored ventilation, respiratory function stable and patient connected to nasal cannula oxygen Cardiovascular status: blood pressure returned to baseline and stable Postop Assessment: no apparent nausea or vomiting Anesthetic complications: no    Last Vitals:  Vitals:   08/19/19 0912 08/19/19 0958  BP: (!) 132/103 123/67  Pulse: (!) 110 83  Resp: 19 18  Temp:  36.9 C  SpO2: 99% 98%    Last Pain:  Vitals:   08/19/19 0958  TempSrc: Oral  PainSc: 0-No pain                 Trevor Iha

## 2019-08-20 ENCOUNTER — Encounter: Payer: Self-pay | Admitting: *Deleted

## 2019-08-26 ENCOUNTER — Ambulatory Visit (HOSPITAL_COMMUNITY)
Admission: RE | Admit: 2019-08-26 | Discharge: 2019-08-26 | Disposition: A | Discharge status: Home / Self Care | Payer: PRIVATE HEALTH INSURANCE | Source: Ambulatory Visit | Attending: Nurse Practitioner | Admitting: Nurse Practitioner

## 2019-08-26 ENCOUNTER — Other Ambulatory Visit: Payer: Self-pay

## 2019-08-26 ENCOUNTER — Encounter (HOSPITAL_COMMUNITY): Payer: Self-pay | Admitting: Nurse Practitioner

## 2019-08-26 VITALS — BP 150/80 | HR 72 | Ht 70.0 in | Wt 220.4 lb

## 2019-08-26 DIAGNOSIS — Z8249 Family history of ischemic heart disease and other diseases of the circulatory system: Secondary | ICD-10-CM | POA: Insufficient documentation

## 2019-08-26 DIAGNOSIS — I4891 Unspecified atrial fibrillation: Secondary | ICD-10-CM | POA: Insufficient documentation

## 2019-08-26 DIAGNOSIS — I10 Essential (primary) hypertension: Secondary | ICD-10-CM | POA: Diagnosis not present

## 2019-08-26 DIAGNOSIS — Z7901 Long term (current) use of anticoagulants: Secondary | ICD-10-CM | POA: Insufficient documentation

## 2019-08-26 DIAGNOSIS — F17210 Nicotine dependence, cigarettes, uncomplicated: Secondary | ICD-10-CM | POA: Insufficient documentation

## 2019-08-26 DIAGNOSIS — I2510 Atherosclerotic heart disease of native coronary artery without angina pectoris: Secondary | ICD-10-CM | POA: Diagnosis not present

## 2019-08-26 DIAGNOSIS — I4819 Other persistent atrial fibrillation: Secondary | ICD-10-CM | POA: Diagnosis not present

## 2019-08-26 DIAGNOSIS — D6869 Other thrombophilia: Secondary | ICD-10-CM

## 2019-08-26 DIAGNOSIS — Z79899 Other long term (current) drug therapy: Secondary | ICD-10-CM | POA: Insufficient documentation

## 2019-08-26 MED ORDER — APIXABAN 5 MG PO TABS: 5.0000 mg | ORAL_TABLET | Freq: Two times a day (BID) | ORAL | 3 refills | Status: DC

## 2019-08-26 NOTE — Addendum Note (Signed)
Encounter addended by: Learta Codding, CMA on: 08/26/2019 1:23 PM  Actions taken: Order Reconciliation Section accessed, Order list changed

## 2019-08-26 NOTE — Progress Notes (Signed)
Primary Care Physician: Christa See, FNP Referring Physician: [facility] ER f/u    Joshua Mejia is a 74 y.o. male with nl known medical history but had not been see by a MD in 74 years in f/u in afib clinic for newly dx afib. Initially  was seen by opthalmologic physician and noted some  spots in his eyes concerning for HTN or DM. He was advised to get established with a PCP. At that visit, he was found to have tachycardia from  which pt was asymptomatic and was sent to the ER. The  EKG confirmed afib. ER MD spoke to Dr. Gillian Shields who suggested BB/DOAC and f/u here.CHA2DS2VASc score of 2.  In the clinic today, pt has been faithful to take eliquis 5 mg bid, states no missed doses . Has been on since 07/20/19. He continues with RVR despite 50 mg metoprolol qd. He  is still  asymptomatic. Denies any exertional dyspnea, fatigue or fluid weight gain. There  is no way  to determine onset of afib.  He did  have asymptomatic Covid-19 in August. 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   He lives alone. Denies snoring. 2 cups of coffee this am. Drinks 2 glasses of wine or 2 beers every night. Does not smoke.  F/u in afib clinic, 08/11/19. He has not missed any doses of anticoagulation and has been on  sufficient amount of time to scheduled for cardioversion. Increase of BB on last visit helped rate control and BP is improved but not optimal.   F/u in afib clinic, 08/26/19. He had successful cardioversion and remains in SR. He does not feel any different in SR. I did start him on stain per Dr. Marlou Porch recommendation for CT angio showing Prominent atherosclerosis is seen within the coronary vessels, greatest in the LAD distribution.   Today, he denies symptoms of palpitations, chest pain, shortness of breath, orthopnea, PND, lower extremity edema, dizziness, presyncope, syncope, or neurologic sequela. The patient is tolerating medications without difficulties and is otherwise without complaint today.     Past Medical History:  Diagnosis Date  . Arrhythmia    Past Surgical History:  Procedure Laterality Date  . CARDIOVERSION N/A 08/19/2019   Procedure: CARDIOVERSION;  Surgeon: Geralynn Rile, MD;  Location: [place];  Service: Cardiovascular;  Laterality: N/A;  . SHOULDER SURGERY      Current Outpatient Medications  Medication Sig Dispense Refill  . apixaban (ELIQUIS) 5 MG TABS tablet Take 1 tablet (5 mg total) by mouth 2 (two) times daily. 60 tablet 0  . metoprolol succinate (TOPROL XL) 50 MG 24 hr tablet Take 1 tablet (50 mg total) by mouth 2 (two) times daily. Take with or immediately following a meal. 60 tablet 1  . rosuvastatin (CRESTOR) 10 MG tablet Take 1 tablet (10 mg total) by mouth daily. 30 tablet 6   No current facility-administered medications for this encounter.    No Known Allergies  Social History   Socioeconomic History  . Marital status: Married    Spouse name: Not on file  . Number of children: Not on file  . Years of education: Not on file  . Highest education level: Not on file  Occupational History  . Not on file  Tobacco Use  . Smoking status: Never Smoker  . Smokeless tobacco: Never Used  Substance and Sexual Activity  . Alcohol use: Not on file  . Drug use: Not Currently  . Sexual activity: Not  on file  Other Topics Concern  . Not on file  Social History Narrative  . Not on file   Social Determinants of Health   Financial Resource Strain:   . Difficulty of Paying Living Expenses:   Food Insecurity:   . Worried About Programme researcher, broadcasting/film/video in the Last Year:   . Barista in the Last Year:   Transportation Needs:   . Freight forwarder (Medical):   Marland Kitchen Lack of Transportation (Non-Medical):   Physical Activity:   . Days of Exercise per Week:   . Minutes of Exercise per Session:   Stress:   . Feeling of Stress :   Social Connections:   . Frequency of Communication with Friends and Family:   . Frequency of Social  Gatherings with Friends and Family:   . Attends Religious Services:   . Active Member of Clubs or Organizations:   . Attends Banker Meetings:   Marland Kitchen Marital Status:   Intimate Partner Violence:   . Fear of Current or Ex-Partner:   . Emotionally Abused:   Marland Kitchen Physically Abused:   . Sexually Abused:     Family History  Problem Relation Age of Onset  . Hypertension Mother     ROS- All systems are reviewed and negative except as per the HPI above  Physical Exam: Vitals:   08/26/19 0841  BP: (!) 150/80  Pulse: 72  Weight: 100 kg  Height: 5\' 10"  (1.778 m)   Wt Readings from Last 3 Encounters:  08/26/19 100 kg  08/04/19 100.7 kg  07/20/19 102.5 kg    Labs: Lab Results  Component Value Date   NA 141 08/19/2019   K 4.9 08/19/2019   CL 106 08/19/2019   CO2 25 08/11/2019   GLUCOSE 95 08/19/2019   BUN 26 (H) 08/19/2019   CREATININE 0.90 08/19/2019   CALCIUM 9.9 08/11/2019   No results found for: INR Lab Results  Component Value Date   CHOL 214 (H) 08/11/2019   HDL 61 08/11/2019   LDLCALC 145 (H) 08/11/2019   TRIG 38 08/11/2019     GEN- The patient is well appearing, alert and oriented x 3 today.   Head- normocephalic, atraumatic Eyes-  Sclera clear, conjunctiva pink Ears- hearing intact Oropharynx- clear Neck- supple, no JVP Lymph- no cervical lymphadenopathy Lungs- Clear to ausculation bilaterally, normal work of breathing Heart-regular rate and rhythm, no murmurs, rubs or gallops, PMI not laterally displaced GI- soft, NT, ND, + BS Extremities- no clubbing, cyanosis, or edema MS- no significant deformity or atrophy Skin- no rash or lesion Psych- euthymic mood, full affect Neuro- strength and sensation are intact  EKG- NSR at 72 bpm, pr int 208 ms, qrs int 104 ms, qtc 413 ms  Assessment and Plan: 1. Newly dx afib Onset of afib unknown Asymptomatic  Successful cardioversion and continues in SR States does not feel any different  Continue   metoprolol ER  50 mg bid Recommend no more than 2 alcoholic drinks a week  Encouraged  to stop smoking  Echo ordered  2. HTN Improved but not optimal Sees PCP next week for further treatment   3. CHA2DS2VASc of 2 Continue eliquis 5 mg bid   4. CAD Per chest angio  On Crestor 10 mg daily, up titration per Dr. 10/11/2019 appointment requested with Dr. Carrington Clamp C. [facility] [address] [place], [place] [phone]

## 2019-08-27 ENCOUNTER — Encounter: Payer: Self-pay | Admitting: Cardiology

## 2019-08-27 ENCOUNTER — Ambulatory Visit: Payer: PRIVATE HEALTH INSURANCE | Admitting: Cardiology

## 2019-08-27 VITALS — BP 164/84 | HR 97 | Ht 70.0 in | Wt 220.4 lb

## 2019-08-27 DIAGNOSIS — I10 Essential (primary) hypertension: Secondary | ICD-10-CM | POA: Insufficient documentation

## 2019-08-27 DIAGNOSIS — I2584 Coronary atherosclerosis due to calcified coronary lesion: Secondary | ICD-10-CM

## 2019-08-27 DIAGNOSIS — E7800 Pure hypercholesterolemia, unspecified: Secondary | ICD-10-CM | POA: Diagnosis not present

## 2019-08-27 DIAGNOSIS — D6869 Other thrombophilia: Secondary | ICD-10-CM | POA: Diagnosis not present

## 2019-08-27 DIAGNOSIS — I2510 Atherosclerotic heart disease of native coronary artery without angina pectoris: Secondary | ICD-10-CM | POA: Insufficient documentation

## 2019-08-27 DIAGNOSIS — I4819 Other persistent atrial fibrillation: Secondary | ICD-10-CM

## 2019-08-27 DIAGNOSIS — I700 Atherosclerosis of aorta: Secondary | ICD-10-CM | POA: Insufficient documentation

## 2019-08-27 MED ORDER — LOSARTAN POTASSIUM 50 MG PO TABS: 50.0000 mg | ORAL_TABLET | Freq: Every day | ORAL | 3 refills | Status: DC

## 2019-08-27 NOTE — Patient Instructions (Signed)
Medication Instructions:  Please start Losartan 50 mg once a day. Continue all other medications as listed.  *If you need a refill on your cardiac medications before your next appointment, please call your pharmacy*  Follow-Up: At [facility], you and your health needs are our priority.  As part of our continuing mission to provide you with exceptional heart care, we have created designated Provider Care Teams.  These Care Teams include your primary Cardiologist (physician) and Advanced Practice Providers (APPs -  Physician Assistants and Nurse Practitioners) who all work together to provide you with the care you need, when you need it.  We recommend signing up for the patient portal called "MyChart".  Sign up information is provided on this After Visit Summary.  MyChart is used to connect with patients for Virtual Visits (Telemedicine).  Patients are able to view lab/test results, encounter notes, upcoming appointments, etc.  Non-urgent messages can be sent to your provider as well.   To learn more about what you can do with MyChart, go to [email or web address].    Your next appointment:   4 week(s)  The format for your next appointment:   In Person  Provider:   Nada Boozer, NP   Thank you for choosing [facility]!!

## 2019-08-27 NOTE — Progress Notes (Signed)
Cardiology Office Note:    Date:  08/27/2019   ID:  Joshua Mejia, DOB Oct 11, 1945, MRN [id number]  PCP:  Ayesha Rumpf, FNP  Cardiologist:  Donato Schultz, MD  Electrophysiologist:  None   Referring MD: Newman Nip, NP     History of Present Illness:    Joshua Mejia is a 74 y.o. male with paroxysmal atrial fibrillation seen by [name] in the atrial fibrillation clinic status post cardioversion maintaining sinus rhythm here to establish care, new patient visit with me.  Asymptomatic.  I had originally spoke with the emergency room physician when he was sent to the emergency room with tachycardia.  He was given beta-blocker and DOAC, Eliquis.  Maintaining compliance with this.  Even though he was on 50 mg twice a day of metoprolol, he remained tachycardic with his atrial fibrillation.  Therefore, he went forward with cardioversion.  He did have an asymptomatic COVID-19 infection in August.  Works as a used Community education officer, Pensions consultant.  Not having any chest pain fevers chills nausea vomiting syncope bleeding orthopnea PND.  No myalgias.  His discovery of atrial fibrillation all started when he tried to get a new pair of glasses.  Was noted to be in rapid A. fib.  Past Medical History:  Diagnosis Date  . Arrhythmia     Past Surgical History:  Procedure Laterality Date  . CARDIOVERSION N/A 08/19/2019   Procedure: CARDIOVERSION;  Surgeon: Sande Rives, MD;  Location: [facility] ENDOSCOPY;  Service: Cardiovascular;  Laterality: N/A;  . SHOULDER SURGERY      Current Medications: Current Meds  Medication Sig  . apixaban (ELIQUIS) 5 MG TABS tablet Take 1 tablet (5 mg total) by mouth 2 (two) times daily.  . metoprolol succinate (TOPROL XL) 50 MG 24 hr tablet Take 1 tablet (50 mg total) by mouth 2 (two) times daily. Take with or immediately following a meal.  . rosuvastatin (CRESTOR) 10 MG tablet Take 1 tablet (10 mg total) by mouth daily.     Allergies:   Patient has no  known allergies.   Social History   Socioeconomic History  . Marital status: Married    Spouse name: Not on file  . Number of children: Not on file  . Years of education: Not on file  . Highest education level: Not on file  Occupational History  . Not on file  Tobacco Use  . Smoking status: Never Smoker  . Smokeless tobacco: Never Used  Substance and Sexual Activity  . Alcohol use: Not on file  . Drug use: Not Currently  . Sexual activity: Not on file  Other Topics Concern  . Not on file  Social History Narrative  . Not on file   Social Determinants of Health   Financial Resource Strain:   . Difficulty of Paying Living Expenses:   Food Insecurity:   . Worried About Programme researcher, broadcasting/film/video in the Last Year:   . Barista in the Last Year:   Transportation Needs:   . Freight forwarder (Medical):   Marland Kitchen Lack of Transportation (Non-Medical):   Physical Activity:   . Days of Exercise per Week:   . Minutes of Exercise per Session:   Stress:   . Feeling of Stress :   Social Connections:   . Frequency of Communication with Friends and Family:   . Frequency of Social Gatherings with Friends and Family:   . Attends Religious Services:   . Active Member of Clubs  or Organizations:   . Attends Archivist Meetings:   Marland Kitchen Marital Status:      Family History: The patient's family history includes Hypertension in his mother.  ROS:   Please see the history of present illness.    No fevers chills nausea vomiting syncope bleeding all other systems reviewed and are negative.  EKGs/Labs/Other Studies Reviewed:    The following studies were reviewed today: Echocardiogram, coronary review.  EKG: Most recent ECG shows sinus rhythm  Recent Labs: 07/20/2019: TSH 2.115 08/11/2019: ALT 27; Platelets 180 08/19/2019: BUN 26; Creatinine, Ser 0.90; Hemoglobin 16.0; Potassium 4.9; Sodium 141  Recent Lipid Panel    Component Value Date/Time   CHOL 214 (H) 08/11/2019 0920    TRIG 38 08/11/2019 0920   HDL 61 08/11/2019 0920   CHOLHDL 3.5 08/11/2019 0920   VLDL 8 08/11/2019 0920   LDLCALC 145 (H) 08/11/2019 0920    Physical Exam:    VS:  BP (!) 164/84   Pulse 97   Ht 5\' 10"  (1.778 m)   Wt 220 lb 6.4 oz (100 kg)   SpO2 97%   BMI 31.62 kg/m     Wt Readings from Last 3 Encounters:  08/27/19 220 lb 6.4 oz (100 kg)  08/26/19 220 lb 6.4 oz (100 kg)  08/04/19 222 lb (100.7 kg)     GEN:  Well nourished, well developed in no acute distress HEENT: Normal NECK: No JVD; No carotid bruits LYMPHATICS: No lymphadenopathy CARDIAC: RRR, no murmurs, rubs, gallops RESPIRATORY:  Clear to auscultation without rales, wheezing or rhonchi  ABDOMEN: Soft, non-tender, non-distended MUSCULOSKELETAL:  No edema; No deformity  SKIN: Warm and dry NEUROLOGIC:  Alert and oriented x 3 PSYCHIATRIC:  Normal affect   ASSESSMENT:    1. Persistent atrial fibrillation ([place])   2. Secondary hypercoagulable state ([place])   3. Pure hypercholesterolemia   4. Coronary artery calcification   5. Aortic atherosclerosis ([place])   6. Essential hypertension    PLAN:    In order of problems listed above:  Paroxysmal atrial fibrillation -Status post cardioversion 08/19/2019.  Sinus rhythm.  Maintaining.  Doing well.  Continue with metoprolol XL 50 mg twice a day.  Apixaban 5 mg twice a day.  Hemoglobin and creatinine normal.  Relatively asymptomatic.  Really did not feel any major difference with cardioversion.  His heart rate was quite fast however. -Has an echocardiogram scheduled in a few days.  Chronic anticoagulation/secondary hypercoagulable state -Continue with Eliquis.  Continue to monitor CBC and basic metabolic profile.  Essential hypertension -Still not at goal.  At home he is checking with his Omron cuff.  Occasionally will still have readings in the 202 systolic range.  He did have 1 isolated reading of 115.  We will go ahead and add losartan 50 mg once a day, angiotensin  receptor blocker to his regimen.  This will be in combination with his Toprol.  In 1 month, we will have him come back in and touch base with our nurse practitioner Mickel Baas to make sure we are at goal.  LAD coronary calcification -Crestor 10 mg has been started by Roderic Palau.  Excellent.  At next clinic visit, we could check a lipid panel.  Family history of CAD -Mother had coronary artery disease in her 58s and died, she was a heavy smoker.  Medication Adjustments/Labs and Tests Ordered: Current medicines are reviewed at length with the patient today.  Concerns regarding medicines are outlined above.  No orders of the  defined types were placed in this encounter.  Meds ordered this encounter  Medications  . losartan (COZAAR) 50 MG tablet    Sig: Take 1 tablet (50 mg total) by mouth daily.    Dispense:  90 tablet    Refill:  3    Patient Instructions  Medication Instructions:  Please start Losartan 50 mg once a day. Continue all other medications as listed.  *If you need a refill on your cardiac medications before your next appointment, please call your pharmacy*  Follow-Up: At [facility], you and your health needs are our priority.  As part of our continuing mission to provide you with exceptional heart care, we have created designated Provider Care Teams.  These Care Teams include your primary Cardiologist (physician) and Advanced Practice Providers (APPs -  Physician Assistants and Nurse Practitioners) who all work together to provide you with the care you need, when you need it.  We recommend signing up for the patient portal called "MyChart".  Sign up information is provided on this After Visit Summary.  MyChart is used to connect with patients for Virtual Visits (Telemedicine).  Patients are able to view lab/test results, encounter notes, upcoming appointments, etc.  Non-urgent messages can be sent to your provider as well.   To learn more about what you can do with MyChart,  go to [email or web address].    Your next appointment:   4 week(s)  The format for your next appointment:   In Person  Provider:   Nada Boozer, NP   Thank you for choosing [facility]!!        Signed, Donato Schultz, MD  08/27/2019 9:51 AM    [facility]

## 2019-08-30 ENCOUNTER — Other Ambulatory Visit (HOSPITAL_COMMUNITY): Payer: Self-pay | Admitting: Nurse Practitioner

## 2019-09-02 ENCOUNTER — Other Ambulatory Visit: Payer: Self-pay

## 2019-09-02 ENCOUNTER — Ambulatory Visit (HOSPITAL_COMMUNITY)
Admission: RE | Admit: 2019-09-02 | Discharge: 2019-09-02 | Disposition: A | Discharge status: Home / Self Care | Payer: PRIVATE HEALTH INSURANCE | Source: Ambulatory Visit | Attending: Nurse Practitioner | Admitting: Nurse Practitioner

## 2019-09-02 DIAGNOSIS — I499 Cardiac arrhythmia, unspecified: Secondary | ICD-10-CM | POA: Insufficient documentation

## 2019-09-02 DIAGNOSIS — I351 Nonrheumatic aortic (valve) insufficiency: Secondary | ICD-10-CM | POA: Diagnosis not present

## 2019-09-02 DIAGNOSIS — I4819 Other persistent atrial fibrillation: Secondary | ICD-10-CM | POA: Diagnosis not present

## 2019-09-02 NOTE — Progress Notes (Signed)
  Echocardiogram 2D Echocardiogram has been performed.  Joshua Mejia 09/02/2019, 9:34 AM

## 2019-09-08 ENCOUNTER — Other Ambulatory Visit: Payer: Self-pay

## 2019-09-08 MED ORDER — ENTRESTO 49-51 MG PO TABS: 1.0000 | ORAL_TABLET | Freq: Two times a day (BID) | ORAL | 11 refills | Status: DC

## 2019-09-23 ENCOUNTER — Other Ambulatory Visit (HOSPITAL_COMMUNITY): Payer: Self-pay | Admitting: Nurse Practitioner

## 2019-10-11 NOTE — Progress Notes (Signed)
Cardiology Office Note   Date:  10/13/2019   ID:  Joshua Mejia, DOB May 25, 1946, MRN [id number]  PCP:  Christa See, FNP  Cardiologist:  Dr. Marlou Porch    Chief Complaint  Patient presents with  . Cardiomyopathy      History of Present Illness: Joshua Mejia is a 74 y.o. male who presents for cardiomyopathy follow up    paroxysmal atrial fibrillation seen by Roderic Palau in the atrial fibrillation clinic status post cardioversion maintaining sinus rhythm here to establish care, new patient visit with me.  Asymptomatic.  I had originally spoke with the emergency room physician when he was sent to the emergency room with tachycardia.  He was given beta-blocker and DOAC, Eliquis.  Maintaining compliance with this.  Even though he was on 50 mg twice a day of metoprolol, he remained tachycardic with his atrial fibrillation.  Therefore, he went forward with cardioversion.  He did have an asymptomatic COVID-19 infection in August.  Works as a used Barrister's clerk, Manufacturing engineer.  Not having any chest pain fevers chills nausea vomiting syncope bleeding orthopnea PND.  No myalgias.  His discovery of atrial fibrillation all started when he tried to get a new pair of glasses.  Was noted to be in rapid A. Fib.  DCCV 08/19/19  SR on last visit  On eliquis.  BP elevated on last visit and losartan was added but with decrease in EF this was changed to entresto will need to increase.  EF 40-45% G3DD Back for BP eval.  Needs lipid panel after addition of statin. And BMP  Today he has no complaints, no chest pain and not SOB.  No awareness of atrial fib though he did not have symptoms when first diagnosed.  We discussed his EF and hopes with medication to return to normal.  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    Past Medical History:  Diagnosis Date  . Arrhythmia     Past Surgical History:  Procedure Laterality Date  . CARDIOVERSION N/A 08/19/2019   Procedure: CARDIOVERSION;  Surgeon: Joshua Rile, MD;  Location: [facility];  Service: Cardiovascular;  Laterality: N/A;  . SHOULDER SURGERY       Current Outpatient Medications  Medication Sig Dispense Refill  . apixaban (ELIQUIS) 5 MG TABS tablet Take 1 tablet (5 mg total) by mouth 2 (two) times daily. 60 tablet 3  . metoprolol succinate (TOPROL-XL) 50 MG 24 hr tablet TAKE 1 TABLET (50 MG TOTAL) BY MOUTH 2 (TWO) TIMES DAILY. TAKE WITH OR IMMEDIATELY FOLLOWING A MEAL. 180 tablet 3  . rosuvastatin (CRESTOR) 10 MG tablet Take 1 tablet (10 mg total) by mouth daily. 30 tablet 6  . sacubitril-valsartan (ENTRESTO) 97-103 MG Take 1 tablet by mouth 2 (two) times daily. 60 tablet 11   No current facility-administered medications for this visit.    Allergies:   Patient has no known allergies.    Social History:  The patient  reports that he has never smoked. He has never used smokeless tobacco. He reports previous drug use.   Family History:  The patient's family history includes Hypertension in his mother.    ROS:  General:no colds or fevers, no weight changes Skin:no rashes or ulcers HEENT:no blurred vision, no congestion CV:see HPI PUL:see HPI GI:no diarrhea constipation or melena, no indigestion GU:no hematuria, no dysuria MS:no joint pain, no claudication Neuro:no syncope, no lightheadedness Endo:no diabetes, no thyroid disease  Wt Readings from Last 3 Encounters:  10/13/19 220 lb 12 oz (100.1 kg)  08/27/19 220 lb 6.4 oz (100 kg)  08/26/19 220 lb 6.4 oz (100 kg)     PHYSICAL EXAM: VS:  BP 134/68   Pulse 76   Ht 5\' 10"  (1.778 m)   Wt 220 lb 12 oz (100.1 kg)   SpO2 99%   BMI 31.67 kg/m  , BMI Body mass index is 31.67 kg/m. General:Pleasant affect, NAD Skin:Warm and dry, brisk capillary refill HEENT:normocephalic, sclera clear, mucus membranes moist Heart:S1S2 RRR without murmur, gallup, rub or click Lungs:clear without rales, rhonchi, or wheezes ,  non tender, + BS, do not palpate liver spleen or masses Ext:no lower ext edema, 2+ pedal pulses, 2+ radial pulses Neuro:alert and oriented X 3, MAE, follows commands, + facial symmetry    EKG:  EKG is NOT ordered today.   Recent Labs: 07/20/2019: TSH 2.115 08/11/2019: ALT 27; Platelets 180 08/19/2019: BUN 26; Creatinine, Ser 0.90; Hemoglobin 16.0; Potassium 4.9; Sodium 141    Lipid Panel    Component Value Date/Time   CHOL 214 (H) 08/11/2019 0920   TRIG 38 08/11/2019 0920   HDL 61 08/11/2019 0920   CHOLHDL 3.5 08/11/2019 0920   VLDL 8 08/11/2019 0920   LDLCALC 145 (H) 08/11/2019 0920       Other studies Reviewed: Additional studies/ records that were reviewed today include: .  Echo 09/02/19 IMPRESSIONS    1. Left ventricular ejection fraction, by estimation, is 40 to 45%. The  left ventricle has mildly decreased function. The left ventricle  demonstrates global hypokinesis. There is mild left ventricular  hypertrophy. Left ventricular diastolic parameters  are consistent with Grade III diastolic dysfunction (restrictive).  Elevated left atrial pressure.  2. Right ventricular systolic function is normal. The right ventricular  size is mildly enlarged. There is mildly elevated pulmonary artery  systolic pressure. The estimated right ventricular systolic pressure is  40.0 mmHg.  3. Left atrial size was moderately dilated.  4. The mitral valve is normal in structure. Mild mitral valve  regurgitation.  5. The aortic valve is tricuspid. Aortic valve regurgitation is mild. No  aortic stenosis is present.  6. The inferior vena cava is normal in size with greater than 50%  respiratory variability, suggesting right atrial pressure of 3 mmHg.   FINDINGS  Left Ventricle: Left ventricular ejection fraction, by estimation, is 40  to 45%. The left ventricle has mildly decreased function. The left  ventricle demonstrates global hypokinesis. The left ventricular internal   cavity size was normal in size. There is  mild left ventricular hypertrophy. Left ventricular diastolic parameters  are consistent with Grade III diastolic dysfunction (restrictive).  Elevated left atrial pressure.   Right Ventricle: The right ventricular size is mildly enlarged. No  increase in right ventricular wall thickness. Right ventricular systolic  function is normal. There is mildly elevated pulmonary artery systolic  pressure. The tricuspid regurgitant  velocity is 3.04 m/s, and with an assumed right atrial pressure of 3 mmHg,  the estimated right ventricular systolic pressure is 40.0 mmHg.   Left Atrium: Left atrial size was moderately dilated.   Right Atrium: Right atrial size was normal in size.   Pericardium: There is no evidence of pericardial effusion.   Mitral Valve: The mitral valve is normal in structure. Moderate mitral  annular calcification. Mild mitral valve regurgitation.   Tricuspid Valve: The tricuspid valve is normal in structure. Tricuspid  valve regurgitation is trivial.  Aortic Valve: The aortic valve is tricuspid. Aortic valve regurgitation is  mild. No aortic stenosis is present.   Pulmonic Valve: The pulmonic valve was grossly normal. Pulmonic valve  regurgitation is not visualized.   Aorta: The aortic root is normal in size and structure.   Venous: The inferior vena cava is normal in size with greater than 50%  respiratory variability, suggesting right atrial pressure of 3 mmHg.   IAS/Shunts: No atrial level shunt detected by color flow Doppler.     LEFT VENTRICLE  PLAX 2D  LVIDd:     5.70 cm Diastology  LVIDs:     4.30 cm LV e' lateral:  9.68 cm/s  LV PW:     1.20 cm LV E/e' lateral: 10.0  LV IVS:    1.20 cm LV e' medial:  5.00 cm/s  LVOT diam:   2.20 cm LV E/e' medial: 19.3  LV SV:     79  LV SV Index:  37  LVOT Area:   3.80 cm     RIGHT VENTRICLE  RV S prime:   12.20 cm/s  TAPSE  (M-mode): 2.0 cm   LEFT ATRIUM       Index    RIGHT ATRIUM      Index  LA diam:    4.60 cm 2.11 cm/m RA Area:   18.20 cm  LA Vol (A2C):  99.7 ml 45.83 ml/m RA Volume:  42.20 ml 19.40 ml/m  LA Vol (A4C):  86.9 ml 39.95 ml/m  LA Biplane Vol: 95.3 ml 43.81 ml/m  AORTIC VALVE  LVOT Vmax:  88.80 cm/s  LVOT Vmean: 63.500 cm/s  LVOT VTI:  0.209 m    AORTA  Ao Root diam: 3.50 cm   MITRAL VALVE        TRICUSPID VALVE  MV Area (PHT): 3.85 cm  TR Peak grad:  37.0 mmHg  MV Decel Time: 197 msec  TR Vmax:    304.00 cm/s  MV E velocity: 96.40 cm/s               SHUNTS               Systemic VTI: 0.21 m               Systemic Diam: 2.20 cm  ASSESSMENT AND PLAN:  1. PAF on eliquis and in SR by exam.  No bleeding.  Pt had no awareness of atrial fib previously.    2.  Cardiomyopathy EF 40-45% with global hypokinesis, started Entresto and will increase today, if labs stable.  Discussed symptoms to watch for and he is not having any now.    3.  HLD on crestor will check lipids today.      4.  Chronic anticoagualtion secondary to hypercoagulable state  5.  HTN controlled but room to increase entresto  6.  CAD with LAD coronary calcification.    Current medicines are reviewed with the patient today.  The patient Has no concerns regarding medicines.  The following changes have been made:  See above Labs/ tests ordered today include:see above  Disposition:   FU:  see above  Signed, Nada Boozer, NP  10/13/2019 8:33 AM    [facility] [address] [place], [place], [place]  [address]/ [address] [place], [place] Phone: [phone]; Fax: [phone]  [phone]

## 2019-10-13 ENCOUNTER — Ambulatory Visit: Payer: PRIVATE HEALTH INSURANCE | Admitting: Cardiology

## 2019-10-13 ENCOUNTER — Encounter: Payer: Self-pay | Admitting: Cardiology

## 2019-10-13 ENCOUNTER — Other Ambulatory Visit: Payer: Self-pay

## 2019-10-13 VITALS — BP 134/68 | HR 76 | Ht 70.0 in | Wt 220.8 lb

## 2019-10-13 DIAGNOSIS — I480 Paroxysmal atrial fibrillation: Secondary | ICD-10-CM | POA: Diagnosis not present

## 2019-10-13 DIAGNOSIS — E7800 Pure hypercholesterolemia, unspecified: Secondary | ICD-10-CM

## 2019-10-13 DIAGNOSIS — I10 Essential (primary) hypertension: Secondary | ICD-10-CM | POA: Diagnosis not present

## 2019-10-13 DIAGNOSIS — I2510 Atherosclerotic heart disease of native coronary artery without angina pectoris: Secondary | ICD-10-CM

## 2019-10-13 DIAGNOSIS — I2584 Coronary atherosclerosis due to calcified coronary lesion: Secondary | ICD-10-CM

## 2019-10-13 DIAGNOSIS — Z7901 Long term (current) use of anticoagulants: Secondary | ICD-10-CM

## 2019-10-13 LAB — COMPREHENSIVE METABOLIC PANEL
ALT: 27 IU/L (ref 0–44)
AST: 29 IU/L (ref 0–40)
Albumin/Globulin Ratio: 1.6 (ref 1.2–2.2)
Albumin: 4.2 g/dL (ref 3.7–4.7)
Alkaline Phosphatase: 73 IU/L (ref 39–117)
BUN/Creatinine Ratio: 17 (ref 10–24)
BUN: 15 mg/dL (ref 8–27)
Bilirubin Total: 0.6 mg/dL (ref 0.0–1.2)
CO2: 23 mmol/L (ref 20–29)
Calcium: 9.5 mg/dL (ref 8.6–10.2)
Chloride: 107 mmol/L — ABNORMAL HIGH (ref 96–106)
Creatinine, Ser: 0.87 mg/dL (ref 0.76–1.27)
GFR calc Af Amer: 98 mL/min/{1.73_m2} (ref >59)
GFR calc non Af Amer: 85 mL/min/{1.73_m2} (ref >59)
Globulin, Total: 2.7 g/dL (ref 1.5–4.5)
Glucose: 103 mg/dL — ABNORMAL HIGH (ref 65–99)
Potassium: 4.6 mmol/L (ref 3.5–5.2)
Sodium: 141 mmol/L (ref 134–144)
Total Protein: 6.9 g/dL (ref 6.0–8.5)

## 2019-10-13 LAB — LIPID PANEL
Chol/HDL Ratio: 2.4 ratio (ref 0.0–5.0)
Cholesterol, Total: 163 mg/dL (ref 100–199)
HDL: 67 mg/dL (ref >39)
LDL Chol Calc (NIH): 83 mg/dL (ref 0–99)
Triglycerides: 68 mg/dL (ref 0–149)
VLDL Cholesterol Cal: 13 mg/dL (ref 5–40)

## 2019-10-13 MED ORDER — ENTRESTO 97-103 MG PO TABS: 1.0000 | ORAL_TABLET | Freq: Two times a day (BID) | ORAL | 11 refills | Status: DC

## 2019-10-13 NOTE — Patient Instructions (Signed)
Medication Instructions:  Your physician has recommended you make the following change in your medication:  1.  INCREASE the Entresto to 97-103 taking 1 twice a day    *If you need a refill on your cardiac medications before your next appointment, please call your pharmacy*   Lab Work: TODAY:  LIPID & CMET  If you have labs (blood work) drawn today and your tests are completely normal, you will receive your results only by: Marland Kitchen MyChart Message (if you have MyChart) OR . A paper copy in the mail If you have any lab test that is abnormal or we need to change your treatment, we will call you to review the results.   Testing/Procedures: None ordered   Follow-Up: At [facility], you and your health needs are our priority.  As part of our continuing mission to provide you with exceptional heart care, we have created designated Provider Care Teams.  These Care Teams include your primary Cardiologist (physician) and Advanced Practice Providers (APPs -  Physician Assistants and Nurse Practitioners) who all work together to provide you with the care you need, when you need it.  We recommend signing up for the patient portal called "MyChart".  Sign up information is provided on this After Visit Summary.  MyChart is used to connect with patients for Virtual Visits (Telemedicine).  Patients are able to view lab/test results, encounter notes, upcoming appointments, etc.  Non-urgent messages can be sent to your provider as well.   To learn more about what you can do with MyChart, go to [email or web address].    Your next appointment:   2 week(s)   10-27-2019 ARRIVE AT 9:00  The format for your next appointment:   In Person  Provider:   You may see Donato Schultz, MD or one of the following Advanced Practice Providers on your designated Care Team:    Norma Fredrickson, NP  Nada Boozer, NP  Georgie Chard, NP    Other Instructions

## 2019-10-20 NOTE — Progress Notes (Signed)
CARDIOLOGY OFFICE NOTE  Date:  10/27/2019    Joshua Mejia Date of Birth: Jul 25, 1945 Medical Record #[id number]  PCP:  Ayesha Rumpf, FNP  Cardiologist:  [facility]  Chief Complaint  Patient presents with  . Follow-up    History of Present Illness: Joshua Mejia is a 74 y.o. male who presents today for a 2 week check. Seen for Dr. Anne Fu.   He has a history of PAF with prior cardioversion in March 2021- on Eliquis, cardiomyopathy, prior COVID illness and reduced LV function. Has been started on Entresto along with statin therapy. CT scan from February of 2021 with aortic atherosclerosis and cardiomegaly.   Seen 2 weeks ago by Vernona Rieger - felt to be doing well. 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    The patient does not have symptoms concerning for COVID-19 infection (fever, chills, cough, or new shortness of breath).   Comes in today. Here alone. Says he is fine. Feels good. Not short of breath. Not dizzy. No chest pain. BP in the 130's at home. He is walking at the car lot. He has no real concerns. No bleeding noted.   Past Medical History:  Diagnosis Date  . Arrhythmia     Past Surgical History:  Procedure Laterality Date  . CARDIOVERSION N/A 08/19/2019   Procedure: CARDIOVERSION;  Surgeon: Sande Rives, MD;  [facility] ENDOSCOPY;  Service: Cardiovascular;  Laterality: N/A;  . SHOULDER SURGERY       Medications: Current Meds  Medication Sig  . apixaban (ELIQUIS) 5 MG TABS tablet Take 1 tablet (5 mg total) by mouth 2 (two) times daily.  . metoprolol succinate (TOPROL-XL) 50 MG 24 hr tablet TAKE 1 TABLET (50 MG TOTAL) BY MOUTH 2 (TWO) TIMES DAILY. TAKE WITH OR IMMEDIATELY FOLLOWING A MEAL.  . rosuvastatin (CRESTOR) 10 MG tablet Take 1 tablet (10 mg total) by mouth daily.  . sacubitril-valsartan (ENTRESTO) 97-103 MG Take 1 tablet by mouth 2 (two) times daily.     Allergies: No Known Allergies  Social History: The patient  reports that he has  never smoked. He has never used smokeless tobacco. He reports previous drug use.   Family History: The patient's family history includes Hypertension in his mother.   Review of Systems: Please see the history of present illness.   All other systems are reviewed and negative.   Physical Exam: VS:  BP (!) 154/86   Pulse 84   Ht 5\' 10"  (1.778 m)   Wt 221 lb 12.8 oz (100.6 kg)   SpO2 98%   BMI 31.82 kg/m  .  BMI Body mass index is 31.82 kg/m.  Wt Readings from Last 3 Encounters:  10/27/19 221 lb 12.8 oz (100.6 kg)  10/13/19 220 lb 12 oz (100.1 kg)  08/27/19 220 lb 6.4 oz (100 kg)   BP recheck by me is 130/84  General: Pleasant. Alert and in no acute distress.   Cardiac: Regular rate and rhythm. No murmurs, rubs, or gallops. No edema.  Respiratory:  Lungs are clear to auscultation bilaterally with normal work of breathing.  GI: Soft and nontender.  MS: No deformity or atrophy. Gait and ROM intact.  Skin: Warm and dry. Color is normal.  Neuro:  Strength and sensation are intact and no gross focal deficits noted.  Psych: Alert, appropriate and with normal affect.   LABORATORY DATA:  EKG:  EKG is not ordered today.    Lab Results  Component Value Date  WBC 7.0 08/11/2019   HGB 16.0 08/19/2019   HCT 47.0 08/19/2019   PLT 180 08/11/2019   GLUCOSE 103 (H) 10/13/2019   CHOL 163 10/13/2019   TRIG 68 10/13/2019   HDL 67 10/13/2019   LDLCALC 83 10/13/2019   ALT 27 10/13/2019   AST 29 10/13/2019   NA 141 10/13/2019   K 4.6 10/13/2019   CL 107 (H) 10/13/2019   CREATININE 0.87 10/13/2019   BUN 15 10/13/2019   CO2 23 10/13/2019   TSH 2.115 07/20/2019     BNP (last 3 results) No results for input(s): BNP in the last 8760 hours.  ProBNP (last 3 results) No results for input(s): PROBNP in the last 8760 hours.   Other Studies Reviewed Today:  ECHO 09/02/19 IMPRESSIONS   1. Left ventricular ejection fraction, by estimation, is 40 to 45%. The  left ventricle has  mildly decreased function. The left ventricle  demonstrates global hypokinesis. There is mild left ventricular  hypertrophy. Left ventricular diastolic parameters  are consistent with Grade III diastolic dysfunction (restrictive).  Elevated left atrial pressure.  2. Right ventricular systolic function is normal. The right ventricular  size is mildly enlarged. There is mildly elevated pulmonary artery  systolic pressure. The estimated right ventricular systolic pressure is  60.6 mmHg.  3. Left atrial size was moderately dilated.  4. The mitral valve is normal in structure. Mild mitral valve  regurgitation.  5. The aortic valve is tricuspid. Aortic valve regurgitation is mild. No  aortic stenosis is present.  6. The inferior vena cava is normal in size with greater than 50%  respiratory variability, suggesting right atrial pressure of 3 mmHg.    ASSESSMENT AND PLAN:  1. 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 He is NYHA I. He is restricting his salt. ?update limit echo on return - will defer to Dr. Marlou Porch.   2. HTN - better control at home noted and recheck by me looks ok. No changes made today - continue Entresto and Toprol.   3. PAF - in sinus by exam.   4. Chronic anticoagulation - no problems noted - recheck CBC today.   5. HLD - on statin therapy - lipids from earlier this month noted.   6. CAD with LAD coronary calcification - CV risk factor modification - continue statin therapy.   7. COVID-19 Education: The signs and symptoms of COVID-19 were discussed with the patient and how to seek care for testing (follow up with PCP or arrange E-visit).  The importance of social distancing, staying at home, hand hygiene and wearing a mask when out in public were discussed today.  Current medicines are reviewed with the patient today.  The patient does not have concerns regarding medicines other than what has been noted above.  The following changes  have been made:  See above.  Labs/ tests ordered today include:   No orders of the defined types were placed in this encounter.    Disposition:   FU with Dr. Marlou Porch in 3 months. Lab today.    Patient is agreeable to this plan and will call if any problems develop in the interim.   SignedTruitt Merle, NP  10/27/2019 9:24 AM  [facility] [address] [place], [place]  [address] Phone: [phone] Fax: [phone]

## 2019-10-27 ENCOUNTER — Other Ambulatory Visit: Payer: Self-pay | Admitting: Nurse Practitioner

## 2019-10-27 ENCOUNTER — Ambulatory Visit: Payer: PRIVATE HEALTH INSURANCE | Admitting: Nurse Practitioner

## 2019-10-27 ENCOUNTER — Encounter: Payer: Self-pay | Admitting: Nurse Practitioner

## 2019-10-27 ENCOUNTER — Other Ambulatory Visit: Payer: Self-pay

## 2019-10-27 VITALS — BP 154/86 | HR 84 | Ht 70.0 in | Wt 221.8 lb

## 2019-10-27 DIAGNOSIS — I10 Essential (primary) hypertension: Secondary | ICD-10-CM

## 2019-10-27 DIAGNOSIS — E7800 Pure hypercholesterolemia, unspecified: Secondary | ICD-10-CM

## 2019-10-27 DIAGNOSIS — I480 Paroxysmal atrial fibrillation: Secondary | ICD-10-CM | POA: Diagnosis not present

## 2019-10-27 DIAGNOSIS — Z79899 Other long term (current) drug therapy: Secondary | ICD-10-CM | POA: Diagnosis not present

## 2019-10-27 DIAGNOSIS — Z7189 Other specified counseling: Secondary | ICD-10-CM

## 2019-10-27 DIAGNOSIS — I4819 Other persistent atrial fibrillation: Secondary | ICD-10-CM

## 2019-10-27 LAB — BASIC METABOLIC PANEL
BUN/Creatinine Ratio: 21 (ref 10–24)
BUN: 19 mg/dL (ref 8–27)
CO2: 24 mmol/L (ref 20–29)
Calcium: 9.6 mg/dL (ref 8.6–10.2)
Chloride: 102 mmol/L (ref 96–106)
Creatinine, Ser: 0.89 mg/dL (ref 0.76–1.27)
GFR calc Af Amer: 97 mL/min/{1.73_m2} (ref >59)
GFR calc non Af Amer: 84 mL/min/{1.73_m2} (ref >59)
Glucose: 98 mg/dL (ref 65–99)
Potassium: 4.6 mmol/L (ref 3.5–5.2)
Sodium: 138 mmol/L (ref 134–144)

## 2019-10-27 LAB — CBC
Hematocrit: 41.1 % (ref 37.5–51.0)
Hemoglobin: 14 g/dL (ref 13.0–17.7)
MCH: 31.8 pg (ref 26.6–33.0)
MCHC: 34.1 g/dL (ref 31.5–35.7)
MCV: 93 fL (ref 79–97)
Platelets: 187 10*3/uL (ref 150–450)
RBC: 4.4 x10E6/uL (ref 4.14–5.80)
RDW: 13.3 % (ref 11.6–15.4)
WBC: 6.7 10*3/uL (ref 3.4–10.8)

## 2019-10-27 NOTE — Patient Instructions (Addendum)
After Visit Summary:  We will be checking the following labs today - BMET & CBC   Medication Instructions:    Continue with your current medicines.    If you need a refill on your cardiac medications before your next appointment, please call your pharmacy.     Testing/Procedures To Be Arranged:  N/A  Follow-Up:   See Dr. Anne Fu in 3 months    At [facility], you and your health needs are our priority.  As part of our continuing mission to provide you with exceptional heart care, we have created designated Provider Care Teams.  These Care Teams include your primary Cardiologist (physician) and Advanced Practice Providers (APPs -  Physician Assistants and Nurse Practitioners) who all work together to provide you with the care you need, when you need it.  Special Instructions:  . Stay safe, stay home, wash your hands for at least 20 seconds and wear a mask when out in public.  . It was good to talk with you today.  Marland Kitchen Keep a check on your BP for [place].  Marland Kitchen Keep restricting your salt    Call the [facility] office at [phone] if you have any questions, problems or concerns.

## 2019-12-20 ENCOUNTER — Other Ambulatory Visit (HOSPITAL_COMMUNITY): Payer: Self-pay | Admitting: Nurse Practitioner

## 2019-12-20 NOTE — Telephone Encounter (Signed)
Prescription refill request for Eliquis received. Indication: Atrial Fibrillation Last office visit: 10/27/2019 Norma Fredrickson Scr: 0.89  10/27/2019 Age: 74 Weight:  100.6 kg  Prescription refill sent

## 2020-02-03 ENCOUNTER — Encounter: Payer: Self-pay | Admitting: Cardiology

## 2020-02-03 ENCOUNTER — Ambulatory Visit: Payer: PRIVATE HEALTH INSURANCE | Admitting: Cardiology

## 2020-02-03 ENCOUNTER — Other Ambulatory Visit: Payer: Self-pay

## 2020-02-03 VITALS — BP 150/70 | HR 74 | Ht 70.0 in | Wt 218.0 lb

## 2020-02-03 DIAGNOSIS — Z79899 Other long term (current) drug therapy: Secondary | ICD-10-CM

## 2020-02-03 DIAGNOSIS — I4819 Other persistent atrial fibrillation: Secondary | ICD-10-CM | POA: Diagnosis not present

## 2020-02-03 DIAGNOSIS — I2510 Atherosclerotic heart disease of native coronary artery without angina pectoris: Secondary | ICD-10-CM

## 2020-02-03 DIAGNOSIS — I700 Atherosclerosis of aorta: Secondary | ICD-10-CM

## 2020-02-03 DIAGNOSIS — Z7901 Long term (current) use of anticoagulants: Secondary | ICD-10-CM

## 2020-02-03 DIAGNOSIS — I2584 Coronary atherosclerosis due to calcified coronary lesion: Secondary | ICD-10-CM

## 2020-02-03 DIAGNOSIS — I5022 Chronic systolic (congestive) heart failure: Secondary | ICD-10-CM

## 2020-02-03 MED ORDER — ROSUVASTATIN CALCIUM 20 MG PO TABS: 20.0000 mg | ORAL_TABLET | Freq: Every day | ORAL | 3 refills | Status: DC

## 2020-02-03 NOTE — Progress Notes (Signed)
Cardiology Office Note:    Date:  02/03/2020   ID:  KAIZEN IBSEN, DOB [birth year], MRN [id number]  PCP:  Ayesha Rumpf, FNP  [facility] Cardiologist:  Donato Schultz, MD  [facility] Electrophysiologist:  None   Referring MD: Ayesha Rumpf, FNP    History of Present Illness:    Joshua Mejia is a 74 y.o. male here for the follow-up of atrial fibrillation.  March 2021-cardioversion on Eliquis.  Had cardiomyopathy reduced EF started on Entresto, aortic atherosclerosis noted.  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  No shortness of breath no chest pain.  Doing well.  See below for further details.  Past Medical History:  Diagnosis Date  . Arrhythmia     Past Surgical History:  Procedure Laterality Date  . CARDIOVERSION N/A 08/19/2019   Procedure: CARDIOVERSION;  Surgeon: Sande Rives, MD;  Location: [facility] ENDOSCOPY;  Service: Cardiovascular;  Laterality: N/A;  . SHOULDER SURGERY      Current Medications: Current Meds  Medication Sig  . ELIQUIS 5 MG TABS tablet TAKE 1 TABLET BY MOUTH TWICE A DAY  . metoprolol succinate (TOPROL-XL) 50 MG 24 hr tablet TAKE 1 TABLET (50 MG TOTAL) BY MOUTH 2 (TWO) TIMES DAILY. TAKE WITH OR IMMEDIATELY FOLLOWING A MEAL.  . sacubitril-valsartan (ENTRESTO) 97-103 MG Take 1 tablet by mouth 2 (two) times daily.  . [DISCONTINUED] rosuvastatin (CRESTOR) 10 MG tablet Take 1 tablet (10 mg total) by mouth daily.     Allergies:   Patient has no known allergies.   Social History   Socioeconomic History  . Marital status: Married    Spouse name: Not on file  . Number of children: Not on file  . Years of education: Not on file  . Highest education level: Not on file  Occupational History  . Not on file  Tobacco Use  . Smoking status: Never Smoker  . Smokeless tobacco: Never Used  Substance and Sexual Activity  . Alcohol use: Not on file  . Drug use: Not Currently  . Sexual activity: Not on file  Other Topics Concern  .  Not on file  Social History Narrative  . Not on file   Social Determinants of Health   Financial Resource Strain:   . Difficulty of Paying Living Expenses: Not on file  Food Insecurity:   . Worried About Programme researcher, broadcasting/film/video in the Last Year: Not on file  . Ran Out of Food in the Last Year: Not on file  Transportation Needs:   . Lack of Transportation (Medical): Not on file  . Lack of Transportation (Non-Medical): Not on file  Physical Activity:   . Days of Exercise per Week: Not on file  . Minutes of Exercise per Session: Not on file  Stress:   . Feeling of Stress : Not on file  Social Connections:   . Frequency of Communication with Friends and Family: Not on file  . Frequency of Social Gatherings with Friends and Family: Not on file  . Attends Religious Services: Not on file  . Active Member of Clubs or Organizations: Not on file  . Attends Banker Meetings: Not on file  . Marital Status: Not on file     Family History: The patient's family history includes Hypertension in his mother.  ROS:   Please see the history of present illness.     All other systems reviewed and are negative.  EKGs/Labs/Other Studies Reviewed:    The following  studies were reviewed today: Echo 09/02/2019-EF 40 to 45%   Recent Labs: 07/20/2019: TSH 2.115 10/13/2019: ALT 27 10/27/2019: BUN 19; Creatinine, Ser 0.89; Hemoglobin 14.0; Platelets 187; Potassium 4.6; Sodium 138  Recent Lipid Panel    Component Value Date/Time   CHOL 163 10/13/2019 0843   TRIG 68 10/13/2019 0843   HDL 67 10/13/2019 0843   CHOLHDL 2.4 10/13/2019 0843   CHOLHDL 3.5 08/11/2019 0920   VLDL 8 08/11/2019 0920   LDLCALC 83 10/13/2019 0843    Physical Exam:    VS:  BP (!) 150/70   Pulse 74   Ht 5\' 10"  (1.778 m)   Wt 218 lb (98.9 kg)   SpO2 96%   BMI 31.28 kg/m     Wt Readings from Last 3 Encounters:  02/03/20 218 lb (98.9 kg)  10/27/19 221 lb 12.8 oz (100.6 kg)  10/13/19 220 lb 12 oz (100.1 kg)      GEN:  Well nourished, well developed in no acute distress HEENT: Normal NECK: No JVD; No carotid bruits LYMPHATICS: No lymphadenopathy CARDIAC: RRR, no murmurs, rubs, gallops RESPIRATORY:  Clear to auscultation without rales, wheezing or rhonchi  ABDOMEN: Soft, non-tender, non-distended MUSCULOSKELETAL:  No edema; No deformity  SKIN: Warm and dry, support hose NEUROLOGIC:  Alert and oriented x 3 PSYCHIATRIC:  Normal affect   ASSESSMENT:    1. Coronary artery calcification   2. Aortic atherosclerosis (HCC)   3. Persistent atrial fibrillation (HCC)   4. Anticoagulation adequate   5. Chronic systolic heart failure (HCC)   6. High risk medication use    PLAN:    In order of problems listed above:  Chronic systolic heart failure -EF 40 to 45% on Entresto beta-blocker NYHA class I.  Doing well.  No chest pain, no shortness of breath. -Since he has been on therapy, we will repeat echocardiogram.  Wearing support stockings.  Essential hypertension -Much better control with medications.  Little high today however.  No changes.  Showed me various blood pressure readings from home, most are in the 130 systolic range.  Excellent.  Paroxysmal atrial fibrillation -Continuing sinus rhythm postconversion.  Chronic anticoagulation -CBC showed hemoglobin of 16, creatinine 0.8.  Excellent.  Prior ALT 27.  No bleeding.  Continue with Eliquis.  Hyperlipidemia -Increase Crestor from 10 mg to 20 mg.  Has LAD calcification.  LDL goal less than 70.  Last check LDL 145 on 08/11/2019, 83 on 10/13/19.  Repeat lipid panel in 3 months.  He did ask if he was going to need to be on these medications lifelong, likely so given his medical issues as above.  78-month follow-up   Medication Adjustments/Labs and Tests Ordered: Current medicines are reviewed at length with the patient today.  Concerns regarding medicines are outlined above.  Orders Placed This Encounter  Procedures  . Lipid panel  .  ECHOCARDIOGRAM COMPLETE   Meds ordered this encounter  Medications  . rosuvastatin (CRESTOR) 20 MG tablet    Sig: Take 1 tablet (20 mg total) by mouth daily.    Dispense:  90 tablet    Refill:  3    Patient Instructions  Medication Instructions:  Please increase your Crestor to 20 mg a day.  Continue all other medications as listed.  *If you need a refill on your cardiac medications before your next appointment, please call your pharmacy*  Lab Work: Please have blood work in 3 months to check your cholesterol. If you have labs (blood work) drawn today and  your tests are completely normal, you will receive your results only by: Marland Kitchen MyChart Message (if you have MyChart) OR . A paper copy in the mail If you have any lab test that is abnormal or we need to change your treatment, we will call you to review the results.   Testing/Procedures: Your physician has requested that you have an echocardiogram. Echocardiography is a painless test that uses sound waves to create images of your heart. It provides your doctor with information about the size and shape of your heart and how well your heart's chambers and valves are working. This procedure takes approximately one hour. There are no restrictions for this procedure.  Follow-Up: At [facility], you and your health needs are our priority.  As part of our continuing mission to provide you with exceptional heart care, we have created designated Provider Care Teams.  These Care Teams include your primary Cardiologist (physician) and Advanced Practice Providers (APPs -  Physician Assistants and Nurse Practitioners) who all work together to provide you with the care you need, when you need it.  We recommend signing up for the patient portal called "MyChart".  Sign up information is provided on this After Visit Summary.  MyChart is used to connect with patients for Virtual Visits (Telemedicine).  Patients are able to view lab/test results, encounter  notes, upcoming appointments, etc.  Non-urgent messages can be sent to your provider as well.   To learn more about what you can do with MyChart, go to [email or web address].    Your next appointment:   6 month(s)  The format for your next appointment:   In Person  Provider:   Donato Schultz, MD   Thank you for choosing [facility]!!        Signed, Donato Schultz, MD  02/03/2020 10:46 AM    [facility]

## 2020-02-03 NOTE — Patient Instructions (Signed)
Medication Instructions:  Please increase your Crestor to 20 mg a day.  Continue all other medications as listed.  *If you need a refill on your cardiac medications before your next appointment, please call your pharmacy*  Lab Work: Please have blood work in 3 months to check your cholesterol. If you have labs (blood work) drawn today and your tests are completely normal, you will receive your results only by: Marland Kitchen MyChart Message (if you have MyChart) OR . A paper copy in the mail If you have any lab test that is abnormal or we need to change your treatment, we will call you to review the results.   Testing/Procedures: Your physician has requested that you have an echocardiogram. Echocardiography is a painless test that uses sound waves to create images of your heart. It provides your doctor with information about the size and shape of your heart and how well your heart's chambers and valves are working. This procedure takes approximately one hour. There are no restrictions for this procedure.  Follow-Up: At [facility], you and your health needs are our priority.  As part of our continuing mission to provide you with exceptional heart care, we have created designated Provider Care Teams.  These Care Teams include your primary Cardiologist (physician) and Advanced Practice Providers (APPs -  Physician Assistants and Nurse Practitioners) who all work together to provide you with the care you need, when you need it.  We recommend signing up for the patient portal called "MyChart".  Sign up information is provided on this After Visit Summary.  MyChart is used to connect with patients for Virtual Visits (Telemedicine).  Patients are able to view lab/test results, encounter notes, upcoming appointments, etc.  Non-urgent messages can be sent to your provider as well.   To learn more about what you can do with MyChart, go to [email or web address].    Your next appointment:   6 month(s)  The  format for your next appointment:   In Person  Provider:   Donato Schultz, MD   Thank you for choosing [facility]!!

## 2020-02-08 ENCOUNTER — Other Ambulatory Visit (HOSPITAL_COMMUNITY): Payer: Self-pay | Admitting: Nurse Practitioner

## 2020-02-15 ENCOUNTER — Ambulatory Visit (HOSPITAL_COMMUNITY): Payer: PRIVATE HEALTH INSURANCE | Attending: Cardiology

## 2020-02-15 ENCOUNTER — Other Ambulatory Visit: Payer: Self-pay

## 2020-02-15 DIAGNOSIS — I5022 Chronic systolic (congestive) heart failure: Secondary | ICD-10-CM | POA: Insufficient documentation

## 2020-02-15 LAB — ECHOCARDIOGRAM COMPLETE
Area-P 1/2: 2.91 cm2
P 1/2 time: 517 msec
S' Lateral: 4.1 cm

## 2020-05-12 ENCOUNTER — Other Ambulatory Visit: Payer: Self-pay

## 2020-05-12 ENCOUNTER — Other Ambulatory Visit: Payer: PRIVATE HEALTH INSURANCE | Admitting: *Deleted

## 2020-05-12 DIAGNOSIS — I2584 Coronary atherosclerosis due to calcified coronary lesion: Secondary | ICD-10-CM

## 2020-05-12 DIAGNOSIS — Z79899 Other long term (current) drug therapy: Secondary | ICD-10-CM

## 2020-05-12 LAB — LIPID PANEL
Chol/HDL Ratio: 2.3 ratio (ref 0.0–5.0)
Cholesterol, Total: 162 mg/dL (ref 100–199)
HDL: 70 mg/dL (ref >39)
LDL Chol Calc (NIH): 82 mg/dL (ref 0–99)
Triglycerides: 45 mg/dL (ref 0–149)
VLDL Cholesterol Cal: 10 mg/dL (ref 5–40)

## 2020-05-15 ENCOUNTER — Telehealth: Payer: Self-pay | Admitting: *Deleted

## 2020-05-15 DIAGNOSIS — Z79899 Other long term (current) drug therapy: Secondary | ICD-10-CM

## 2020-05-15 DIAGNOSIS — E7800 Pure hypercholesterolemia, unspecified: Secondary | ICD-10-CM

## 2020-05-15 MED ORDER — EZETIMIBE 10 MG PO TABS: 10.0000 mg | ORAL_TABLET | Freq: Every day | ORAL | 3 refills | Status: DC

## 2020-05-15 NOTE — Telephone Encounter (Signed)
Slightly better LDL at 82 from 83 with increasing Crestor from 10 mg up to 20 mg daily. We are still not at goal of less than 70. Lets go ahead and add Zetia 10 mg a day to his Crestor 20 mg daily. Recheck lipid panel and ALT in 3 months.  Joshua Schultz, Joshua Mejia   Pt aware of results and above orders from Dr Anne Fu.  RX sent electronically into [facility] at pt's request.  F/u appt scheduled with Dr Anne Fu 08/03/2020 as he was due for 3 month f/u then.  Pt will repeat lab at that appt.  He will call back prior to then if any questions/concerns.  He was grateful for to call and information.

## 2020-06-01 ENCOUNTER — Other Ambulatory Visit (HOSPITAL_COMMUNITY): Payer: Self-pay | Admitting: Nurse Practitioner

## 2020-06-05 NOTE — Telephone Encounter (Signed)
Eliquis 5mg  refill request received. Patient is 74 years old, weight-98.9kg, Crea-0.89 on 10/27/2019, Diagnosis-Afib, and last seen by Dr. 10/29/2019 on 02/03/2020. Dose is appropriate based on dosing criteria. Will send in refill to requested pharmacy.

## 2020-08-03 ENCOUNTER — Other Ambulatory Visit: Payer: Self-pay

## 2020-08-03 ENCOUNTER — Ambulatory Visit: Payer: PRIVATE HEALTH INSURANCE | Admitting: Cardiology

## 2020-08-03 ENCOUNTER — Encounter: Payer: Self-pay | Admitting: Cardiology

## 2020-08-03 VITALS — BP 150/80 | HR 76 | Ht 70.0 in | Wt 219.0 lb

## 2020-08-03 DIAGNOSIS — I4819 Other persistent atrial fibrillation: Secondary | ICD-10-CM

## 2020-08-03 DIAGNOSIS — E7800 Pure hypercholesterolemia, unspecified: Secondary | ICD-10-CM | POA: Diagnosis not present

## 2020-08-03 DIAGNOSIS — I10 Essential (primary) hypertension: Secondary | ICD-10-CM | POA: Diagnosis not present

## 2020-08-03 DIAGNOSIS — Z79899 Other long term (current) drug therapy: Secondary | ICD-10-CM

## 2020-08-03 DIAGNOSIS — I700 Atherosclerosis of aorta: Secondary | ICD-10-CM

## 2020-08-03 DIAGNOSIS — I2584 Coronary atherosclerosis due to calcified coronary lesion: Secondary | ICD-10-CM

## 2020-08-03 DIAGNOSIS — I5022 Chronic systolic (congestive) heart failure: Secondary | ICD-10-CM

## 2020-08-03 DIAGNOSIS — I2510 Atherosclerotic heart disease of native coronary artery without angina pectoris: Secondary | ICD-10-CM | POA: Diagnosis not present

## 2020-08-03 DIAGNOSIS — I480 Paroxysmal atrial fibrillation: Secondary | ICD-10-CM

## 2020-08-03 LAB — CBC
Hematocrit: 41 % (ref 37.5–51.0)
Hemoglobin: 13.8 g/dL (ref 13.0–17.7)
MCH: 30.9 pg (ref 26.6–33.0)
MCHC: 33.7 g/dL (ref 31.5–35.7)
MCV: 92 fL (ref 79–97)
Platelets: 192 10*3/uL (ref 150–450)
RBC: 4.46 x10E6/uL (ref 4.14–5.80)
RDW: 12.4 % (ref 11.6–15.4)
WBC: 6.4 10*3/uL (ref 3.4–10.8)

## 2020-08-03 LAB — LIPID PANEL
Chol/HDL Ratio: 2 ratio (ref 0.0–5.0)
Cholesterol, Total: 132 mg/dL (ref 100–199)
HDL: 65 mg/dL (ref >39)
LDL Chol Calc (NIH): 58 mg/dL (ref 0–99)
Triglycerides: 32 mg/dL (ref 0–149)
VLDL Cholesterol Cal: 9 mg/dL (ref 5–40)

## 2020-08-03 LAB — COMPREHENSIVE METABOLIC PANEL
ALT: 25 IU/L (ref 0–44)
AST: 24 IU/L (ref 0–40)
Albumin/Globulin Ratio: 1.8 (ref 1.2–2.2)
Albumin: 4.4 g/dL (ref 3.7–4.7)
Alkaline Phosphatase: 64 IU/L (ref 44–121)
BUN/Creatinine Ratio: 12 (ref 10–24)
BUN: 11 mg/dL (ref 8–27)
Bilirubin Total: 0.4 mg/dL (ref 0.0–1.2)
CO2: 25 mmol/L (ref 20–29)
Calcium: 9.4 mg/dL (ref 8.6–10.2)
Chloride: 103 mmol/L (ref 96–106)
Creatinine, Ser: 0.91 mg/dL (ref 0.76–1.27)
GFR calc Af Amer: 95 mL/min/{1.73_m2} (ref >59)
GFR calc non Af Amer: 82 mL/min/{1.73_m2} (ref >59)
Globulin, Total: 2.4 g/dL (ref 1.5–4.5)
Glucose: 110 mg/dL — ABNORMAL HIGH (ref 65–99)
Potassium: 5.5 mmol/L — ABNORMAL HIGH (ref 3.5–5.2)
Sodium: 141 mmol/L (ref 134–144)
Total Protein: 6.8 g/dL (ref 6.0–8.5)

## 2020-08-03 MED ORDER — SPIRONOLACTONE 25 MG PO TABS: 12.5000 mg | ORAL_TABLET | Freq: Every day | ORAL | 3 refills | Status: DC

## 2020-08-03 NOTE — Patient Instructions (Addendum)
Medication Instructions:  Your physician has recommended you make the following change in your medication:  1: Start on Spirolactone 12.5 mg daily *If you need a refill on your cardiac medications before your next appointment, please call your pharmacy*   Lab Work: Today: CMET; Lipid, CBC today.  BMET 2 weeks If you have labs (blood work) drawn today and your tests are completely normal, you will receive your results only by: Marland Kitchen MyChart Message (if you have MyChart) OR . A paper copy in the mail If you have any lab test that is abnormal or we need to change your treatment, we will call you to review the results.   Testing/Procedures: none   Follow-Up: At [facility], you and your health needs are our priority.  As part of our continuing mission to provide you with exceptional heart care, we have created designated Provider Care Teams.  These Care Teams include your primary Cardiologist (physician) and Advanced Practice Providers (APPs -  Physician Assistants and Nurse Practitioners) who all work together to provide you with the care you need, when you need it.  We recommend signing up for the patient portal called "MyChart".  Sign up information is provided on this After Visit Summary.  MyChart is used to connect with patients for Virtual Visits (Telemedicine).  Patients are able to view lab/test results, encounter notes, upcoming appointments, etc.  Non-urgent messages can be sent to your provider as well.   To learn more about what you can do with MyChart, go to [email or web address].    Your next appointment:   6 month(s)  The format for your next appointment:   In Person  Provider:   You may see Donato Schultz, MD or one of the following Advanced Practice Providers on your designated Care Team:    Georgie Chard, NP

## 2020-08-03 NOTE — Progress Notes (Signed)
Cardiology Office Note:    Date:  08/03/2020   ID:  Joshua Mejia, DOB May 23, 1946, MRN [id number]  PCP:  Ayesha Rumpf, FNP   [facility]  Cardiologist:  Donato Schultz, MD  Advanced Practice Provider:  No care team member to display Electrophysiologist:  None       Referring MD: Ayesha Rumpf, FNP     History of Present Illness:    Joshua Mejia is a 75 y.o. male here for follow up of AFIB  In March 2021 - cardioversion on Eliquis  Feels well, walking, NYHA 1.   Post COVID.  No CP, no SOB.  Works at [employer].  Walks about 4 to 5 miles a day.  Excellent.  See below for details.    Past Medical History:  Diagnosis Date  . Arrhythmia     Past Surgical History:  Procedure Laterality Date  . CARDIOVERSION N/A 08/19/2019   Procedure: CARDIOVERSION;  Surgeon: Sande Rives, MD;  Location: [facility] ENDOSCOPY;  Service: Cardiovascular;  Laterality: N/A;  . SHOULDER SURGERY      Current Medications: Current Meds  Medication Sig  . ELIQUIS 5 MG TABS tablet TAKE 1 TABLET BY MOUTH TWICE A DAY  . ezetimibe (ZETIA) 10 MG tablet Take 1 tablet (10 mg total) by mouth daily.  . metoprolol succinate (TOPROL-XL) 50 MG 24 hr tablet TAKE 1 TABLET (50 MG TOTAL) BY MOUTH 2 (TWO) TIMES DAILY. TAKE WITH OR IMMEDIATELY FOLLOWING A MEAL.  . rosuvastatin (CRESTOR) 20 MG tablet Take 1 tablet (20 mg total) by mouth daily.  . sacubitril-valsartan (ENTRESTO) 97-103 MG Take 1 tablet by mouth 2 (two) times daily.  Marland Kitchen spironolactone (ALDACTONE) 25 MG tablet Take 0.5 tablets (12.5 mg total) by mouth daily.     Allergies:   Patient has no known allergies.   Social History   Socioeconomic History  . Marital status: Married    Spouse name: Not on file  . Number of children: Not on file  . Years of education: Not on file  . Highest education level: Not on file  Occupational History  . Not on file  Tobacco Use  . Smoking status: Never Smoker  . Smokeless  tobacco: Never Used  Substance and Sexual Activity  . Alcohol use: Not on file  . Drug use: Not Currently  . Sexual activity: Not on file  Other Topics Concern  . Not on file  Social History Narrative  . Not on file   Social Determinants of Health   Financial Resource Strain: Not on file  Food Insecurity: Not on file  Transportation Needs: Not on file  Physical Activity: Not on file  Stress: Not on file  Social Connections: Not on file     Family History: The patient's family history includes Hypertension in his mother.  ROS:   Please see the history of present illness.     All other systems reviewed and are negative.  EKGs/Labs/Other Studies Reviewed:    The following studies were reviewed today:   EKG:  EKG is  ordered today.  The ekg ordered today demonstrates sinus rhythm 76 with no other specific abnormalities.  Recent Labs: 10/13/2019: ALT 27 10/27/2019: BUN 19; Creatinine, Ser 0.89; Hemoglobin 14.0; Platelets 187; Potassium 4.6; Sodium 138  Recent Lipid Panel    Component Value Date/Time   CHOL 162 05/12/2020 0736   TRIG 45 05/12/2020 0736   HDL 70 05/12/2020 0736   CHOLHDL 2.3 05/12/2020 0736  CHOLHDL 3.5 08/11/2019 0920   VLDL 8 08/11/2019 0920   LDLCALC 82 05/12/2020 0736     Risk Assessment/Calculations:      Physical Exam:    VS:  BP (!) 150/80 (BP Location: Left Arm, Patient Position: Sitting, Cuff Size: Normal)   Pulse 76   Ht 5\' 10"  (1.778 m)   Wt 219 lb (99.3 kg)   SpO2 98%   BMI 31.42 kg/m     Wt Readings from Last 3 Encounters:  08/03/20 219 lb (99.3 kg)  02/03/20 218 lb (98.9 kg)  10/27/19 221 lb 12.8 oz (100.6 kg)     GEN:  Well nourished, well developed in no acute distress HEENT: Normal NECK: No JVD; No carotid bruits LYMPHATICS: No lymphadenopathy CARDIAC: RRR, no murmurs, rubs, gallops RESPIRATORY:  Clear to auscultation without rales, wheezing or rhonchi  ABDOMEN: Soft, non-tender, non-distended MUSCULOSKELETAL:  No  edema; No deformity  SKIN: Warm and dry NEUROLOGIC:  Alert and oriented x 3 PSYCHIATRIC:  Normal affect   ASSESSMENT:    1. Coronary artery calcification   2. Aortic atherosclerosis (HCC)   3. Pure hypercholesterolemia   4. Chronic systolic heart failure (HCC)   5. Paroxysmal atrial fibrillation (HCC)   6. Medication management   7. Persistent atrial fibrillation (HCC)   8. Essential hypertension    PLAN:    In order of problems listed above:  Chronic systolic heart failure -EF has been 40 to 45% -Continue with Entresto -NYHA class I type symptoms. Excellent. He has shown marked improvement on Entresto. -Continuing to wear support stockings. 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 -We will add spironolactone 12.5 mg once a day to his regimen.  In 2 weeks we will check a basic metabolic profile.  Today we are checking a complete metabolic profile for baseline.  Paroxysmal atrial fibrillation -Continuing to maintain sinus rhythm postconversion. Excellent.  Chronic anticoagulation -Prior hemoglobin 16, stable doing well. No bleeding.  Hyperlipidemia -Previously increased his Crestor to 20 mg and added Zetia 10.  Coronary artery calcification -LAD calcification noted on CT scan personally reviewed. Statin checking lipid panel.,  Zetia.    Medication Adjustments/Labs and Tests Ordered: Current medicines are reviewed at length with the patient today.  Concerns regarding medicines are outlined above.  Orders Placed This Encounter  Procedures  . Basic metabolic panel  . CBC  . Comprehensive metabolic panel  . Lipid panel  . EKG 12-Lead   Meds ordered this encounter  Medications  . spironolactone (ALDACTONE) 25 MG tablet    Sig: Take 0.5 tablets (12.5 mg total) by mouth daily.    Dispense:  45 tablet    Refill:  3    Patient Instructions  Medication Instructions:  Your physician has recommended you make the following change in your medication:  1:  Start on Spirolactone 12.5 mg daily *If you need a refill on your cardiac medications before your next appointment, please call your pharmacy*   Lab Work: Today: CMET; Lipid, CBC today.  BMET 2 weeks If you have labs (blood work) drawn today and your tests are completely normal, you will receive your results only by: 10/29/19 MyChart Message (if you have MyChart) OR . A paper copy in the mail If you have any lab test that is abnormal or we need to change your treatment, we will call you to review the results.   Testing/Procedures: none   Follow-Up: At [facility], you and your health needs are  our priority.  As part of our continuing mission to provide you with exceptional heart care, we have created designated Provider Care Teams.  These Care Teams include your primary Cardiologist (physician) and Advanced Practice Providers (APPs -  Physician Assistants and Nurse Practitioners) who all work together to provide you with the care you need, when you need it.  We recommend signing up for the patient portal called "MyChart".  Sign up information is provided on this After Visit Summary.  MyChart is used to connect with patients for Virtual Visits (Telemedicine).  Patients are able to view lab/test results, encounter notes, upcoming appointments, etc.  Non-urgent messages can be sent to your provider as well.   To learn more about what you can do with MyChart, go to [email or web address].    Your next appointment:   6 month(s)  The format for your next appointment:   In Person  Provider:   You may see Donato Schultz, MD or one of the following Advanced Practice Providers on your designated Care Team:    Georgie Chard, NP       Signed, Donato Schultz, MD  08/03/2020 8:55 AM    [facility]

## 2020-08-04 ENCOUNTER — Other Ambulatory Visit: Payer: Self-pay | Admitting: *Deleted

## 2020-08-04 NOTE — Progress Notes (Signed)
Potassium is 5.5 (11 months ago was 5.8, but 9 months ago 4.6)  Because of high potassium, let's avoid using spironolactone 12.5mg   Stop new start spironolactone and cancel next BMET   Donato Schultz, MD   Pt is aware of the above instructions.

## 2020-08-17 ENCOUNTER — Other Ambulatory Visit: Payer: PRIVATE HEALTH INSURANCE

## 2020-10-02 ENCOUNTER — Other Ambulatory Visit (HOSPITAL_COMMUNITY): Payer: Self-pay | Admitting: Cardiology

## 2020-10-14 ENCOUNTER — Other Ambulatory Visit: Payer: Self-pay | Admitting: Cardiology

## 2020-11-24 ENCOUNTER — Telehealth: Payer: Self-pay | Admitting: Cardiology

## 2020-11-24 MED ORDER — ELIQUIS 5 MG PO TABS: 1.0000 | ORAL_TABLET | Freq: Two times a day (BID) | ORAL | 1 refills | Status: DC

## 2020-11-24 NOTE — Telephone Encounter (Signed)
74 M 99.3 kg, SCr 0.91.  LOV Skains 2/22

## 2020-11-24 NOTE — Telephone Encounter (Signed)
Pt c/o medication issue:  1. Name of Medication: ELIQUIS 5 MG TABS tablet  2. How are you currently taking this medication (dosage and times per day)? As priscribed  3. Are you having a reaction (difficulty breathing--STAT)?   4. What is your medication issue? Pt states that the pharmacy is saying his script expired

## 2021-02-04 ENCOUNTER — Other Ambulatory Visit: Payer: Self-pay | Admitting: Cardiology

## 2021-02-05 ENCOUNTER — Encounter: Payer: Self-pay | Admitting: Physician Assistant

## 2021-02-05 NOTE — Progress Notes (Signed)
Cardiology Office Note    Date:  02/06/2021   ID:  Joshua, Mejia 1946/03/03, MRN [id number]  PCP:  Ayesha Rumpf, FNP  Cardiologist:  Donato Schultz, MD  Electrophysiologist:  None   Chief Complaint: f/u atrial fib  History of Present Illness:   Joshua Mejia is a 75 y.o. male with history of paroxysmal atrial fib, chronic combined CHF, first degree AVB, coronary artery calcification and aortic atherosclerosis by prior CT (LAD per Dr. Anne Fu' review), HTN, HLD who presents for cardiology f/u. He was initially diagnosed with afib in 2021. He had gone for an eye exam and had findings that he was told may represent HTN or DM, so he followed up with his GP who incidentally found atrial fib. He was asymptomatic at the time. 2D echo 08/2019 showed mildly decreased EF 40-45%. He underwent DCCV 08/19/19. F/u echo 02/2020 EF 55%, g2DD, normal Rv, mod LAE. At last OV 07/2019, spironolactone was prescribed but he was asked not to start due to baseline hyperkalemia. No prior ischemic eval.  He presents back for follow-up today feeling great. He has not had any interim cardiac symptoms of palpitations, CP, dyspnea, dizziness, syncope. No bleeding reported. 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 He drinks 2-3 beers daily. He has a BP cuff at home but does not check it regularly.  Labwork independently reviewed: 07/2020 LDL 58, K 5.5, Cr 0.91, LFTs wnl, CBC wnl 07/2019 TSH wnl   Past Medical History:  Diagnosis Date   Aortic atherosclerosis (HCC)    Chronic combined systolic and diastolic CHF (congestive heart failure) (HCC)    Coronary artery calcification seen on CT scan    First degree AV block    Hyperkalemia    Hyperlipidemia LDL goal <70    PAF (paroxysmal atrial fibrillation) (HCC)     Past Surgical History:  Procedure Laterality Date   CARDIOVERSION N/A 08/19/2019   Procedure: CARDIOVERSION;  Surgeon: Sande Rives, MD;  Location: [facility]  ENDOSCOPY;  Service: Cardiovascular;  Laterality: N/A;   SHOULDER SURGERY      Current Medications: Current Meds  Medication Sig   apixaban (ELIQUIS) 5 MG TABS tablet Take 1 tablet (5 mg total) by mouth 2 (two) times daily.   ENTRESTO 97-103 MG TAKE 1 TABLET BY MOUTH 2 (TWO) TIMES DAILY.   ezetimibe (ZETIA) 10 MG tablet Take 1 tablet (10 mg total) by mouth daily.   metoprolol succinate (TOPROL-XL) 50 MG 24 hr tablet TAKE 1 TABLET (50 MG TOTAL) BY MOUTH 2 (TWO) TIMES DAILY. TAKE WITH OR IMMEDIATELY FOLLOWING A MEAL.   rosuvastatin (CRESTOR) 20 MG tablet Take 1 tablet (20 mg total) by mouth daily.      Allergies:   Patient has no known allergies.   Social History   Socioeconomic History   Marital status: Married    Spouse name: Not on file   Number of children: Not on file   Years of education: Not on file   Highest education level: Not on file  Occupational History   Not on file  Tobacco Use   Smoking status: Never   Smokeless tobacco: Never  Substance and Sexual Activity   Alcohol use: Not on file   Drug use: Not Currently   Sexual activity: Not on file  Other Topics Concern   Not on file  Social History Narrative   Not on file   Social Determinants of Health   Financial  Resource Strain: Not on file  Food Insecurity: Not on file  Transportation Needs: Not on file  Physical Activity: Not on file  Stress: Not on file  Social Connections: Not on file     Family History:  The patient's family history includes Hypertension in his mother.  ROS:   Please see the history of present illness.  All other systems are reviewed and otherwise negative.    EKGs/Labs/Other Studies Reviewed:    Studies reviewed are outlined and summarized above. Reports included below if pertinent.  2d echo 02/2020  1. Left ventricular ejection fraction, by estimation, is 55%. The left  ventricle has normal function. The left ventricle has no regional wall  motion abnormalities. Left  ventricular diastolic parameters are consistent  with Grade II diastolic dysfunction  (pseudonormalization).   2. Right ventricular systolic function is normal. The right ventricular  size is normal. There is normal pulmonary artery systolic pressure. The  estimated right ventricular systolic pressure is 26.2 mmHg.   3. Left atrial size was moderately dilated.   4. The mitral valve is normal in structure. Trivial mitral valve  regurgitation. No evidence of mitral stenosis.   5. The aortic valve is tricuspid. Aortic valve regurgitation is mild. No  aortic stenosis is present.   6. The inferior vena cava is normal in size with greater than 50%  respiratory variability, suggesting right atrial pressure of 3 mmHg.  2D echo 08/2019   1. Left ventricular ejection fraction, by estimation, is 40 to 45%. The  left ventricle has mildly decreased function. The left ventricle  demonstrates global hypokinesis. There is mild left ventricular  hypertrophy. Left ventricular diastolic parameters  are consistent with Grade III diastolic dysfunction (restrictive).  Elevated left atrial pressure.   2. Right ventricular systolic function is normal. The right ventricular  size is mildly enlarged. There is mildly elevated pulmonary artery  systolic pressure. The estimated right ventricular systolic pressure is  40.0 mmHg.   3. Left atrial size was moderately dilated.   4. The mitral valve is normal in structure. Mild mitral valve  regurgitation.   5. The aortic valve is tricuspid. Aortic valve regurgitation is mild. No  aortic stenosis is present.   6. The inferior vena cava is normal in size with greater than 50%  respiratory variability, suggesting right atrial pressure of 3 mmHg    EKG:  EKG is ordered today, personally reviewed, demonstrating NSR 69bpm, first degree AVB, no change from prior  Recent Labs: 08/03/2020: ALT 25; BUN 11; Creatinine, Ser 0.91; Hemoglobin 13.8; Platelets 192; Potassium  5.5; Sodium 141  Recent Lipid Panel    Component Value Date/Time   CHOL 132 08/03/2020 0854   TRIG 32 08/03/2020 0854   HDL 65 08/03/2020 0854   CHOLHDL 2.0 08/03/2020 0854   CHOLHDL 3.5 08/11/2019 0920   VLDL 8 08/11/2019 0920   LDLCALC 58 08/03/2020 0854    PHYSICAL EXAM:    VS:  BP 138/80   Pulse 69   Ht 5\' 10"  (1.778 m)   Wt 221 lb 9.6 oz (100.5 kg)   SpO2 98%   BMI 31.80 kg/m   BMI: Body mass index is 31.8 kg/m.  GEN: Well nourished, well developed male in no acute distress HEENT: normocephalic, atraumatic Neck: no JVD, carotid bruits, or masses Cardiac: RRR; no murmurs, rubs, or gallops, no edema  Respiratory:  clear to auscultation bilaterally, normal work of breathing GI: soft, nontender, nondistended, + BS MS: no deformity or atrophy Skin:  warm and dry, no rash Neuro:  Alert and Oriented x 3, Strength and sensation are intact, follows commands Psych: euthymic mood, full affect  Wt Readings from Last 3 Encounters:  02/06/21 221 lb 9.6 oz (100.5 kg)  08/03/20 219 lb (99.3 kg)  02/03/20 218 lb (98.9 kg)     ASSESSMENT & PLAN:   1. Paroxysmal atrial fibrillation - maintaining NSR. CHADSVASC is 5 for CHF, HTN, age, vascular disease (atherosclerosis). Continue Eliquis. Bleeding precautions reviewed. We also discussed role of limiting ETOH as it pertains to atrial fib, recurrence rates and bleeding risk. Continue metoprolol. First degree AVB noted on EKG, stable. Will update surveillance CBC with labs today.  2. Chronic combined CHF/cardiomyopathy - appears euvolemic on examination. LV dysfunction may have been due to atrial fib at the time as it resolved with restoration of NSR. Continue metoprolol and Entresto. F/u BMET today due to prior hyperkalemia.   3. Essential HTN - Suboptimal blood pressure control noted today, goal SBP<130. We need a better idea of what it's running at home. The patient was provided instructions on monitoring blood pressure at home for 3-4  days and relaying results to our office. Now that LV function has normalized, consideration could be given to addition of amlodipine (vs addition of thiazide diuretic if potassium dictates).  4. Hyperkalemia - recheck labs today  5. Coronary calcification/aortic atherosclerosis - continue risk factor modification with statin therapy and HTN control. No prior ischemic evaluation on file - he is not describing any cardiac symptoms that would warrant pursuit at this time, but continue to follow clinically. Not on ASA due to concomitant Eliquis.   6. Hyperlipidemia - last lipids reviewed from 07/2020 and were at goal. Continue rosuvastatin and Zetia. Can consider annual f/u of lipids at next OV if not obtained elsewhere in the interim.   Disposition: F/u with Dr. Anne Fu in 6 months.   Medication Adjustments/Labs and Tests Ordered: Current medicines are reviewed at length with the patient today.  Concerns regarding medicines are outlined above. Medication changes, Labs and Tests ordered today are summarized above and listed in the Patient Instructions accessible in Encounters.   Signed, Laurann Montana, PA-C  02/06/2021 9:09 AM    [facility] [address] [place], [place], [place]  [address] Phone: [phone]; Fax: [phone]

## 2021-02-06 ENCOUNTER — Other Ambulatory Visit: Payer: Self-pay

## 2021-02-06 ENCOUNTER — Ambulatory Visit: Payer: No Typology Code available for payment source | Admitting: Physician Assistant

## 2021-02-06 ENCOUNTER — Encounter: Payer: Self-pay | Admitting: Physician Assistant

## 2021-02-06 VITALS — BP 138/80 | HR 69 | Ht 70.0 in | Wt 221.6 lb

## 2021-02-06 DIAGNOSIS — I10 Essential (primary) hypertension: Secondary | ICD-10-CM | POA: Diagnosis not present

## 2021-02-06 DIAGNOSIS — I5042 Chronic combined systolic (congestive) and diastolic (congestive) heart failure: Secondary | ICD-10-CM

## 2021-02-06 DIAGNOSIS — E785 Hyperlipidemia, unspecified: Secondary | ICD-10-CM

## 2021-02-06 DIAGNOSIS — I2510 Atherosclerotic heart disease of native coronary artery without angina pectoris: Secondary | ICD-10-CM | POA: Diagnosis not present

## 2021-02-06 DIAGNOSIS — I480 Paroxysmal atrial fibrillation: Secondary | ICD-10-CM | POA: Diagnosis not present

## 2021-02-06 DIAGNOSIS — E875 Hyperkalemia: Secondary | ICD-10-CM

## 2021-02-06 DIAGNOSIS — I2584 Coronary atherosclerosis due to calcified coronary lesion: Secondary | ICD-10-CM

## 2021-02-06 LAB — BASIC METABOLIC PANEL
BUN/Creatinine Ratio: 19 (ref 10–24)
BUN: 15 mg/dL (ref 8–27)
CO2: 23 mmol/L (ref 20–29)
Calcium: 9.2 mg/dL (ref 8.6–10.2)
Chloride: 105 mmol/L (ref 96–106)
Creatinine, Ser: 0.77 mg/dL (ref 0.76–1.27)
Glucose: 93 mg/dL (ref 65–99)
Potassium: 4.8 mmol/L (ref 3.5–5.2)
Sodium: 139 mmol/L (ref 134–144)
eGFR: 93 mL/min/{1.73_m2} (ref >59)

## 2021-02-06 LAB — CBC
Hematocrit: 40.2 % (ref 37.5–51.0)
Hemoglobin: 13.6 g/dL (ref 13.0–17.7)
MCH: 31.1 pg (ref 26.6–33.0)
MCHC: 33.8 g/dL (ref 31.5–35.7)
MCV: 92 fL (ref 79–97)
Platelets: 172 10*3/uL (ref 150–450)
RBC: 4.37 x10E6/uL (ref 4.14–5.80)
RDW: 12.9 % (ref 11.6–15.4)
WBC: 6.1 10*3/uL (ref 3.4–10.8)

## 2021-02-06 MED ORDER — ROSUVASTATIN CALCIUM 20 MG PO TABS: 20.0000 mg | ORAL_TABLET | Freq: Every day | ORAL | 3 refills | Status: DC

## 2021-02-06 NOTE — Patient Instructions (Signed)
Medication Instructions:  Your physician recommends that you continue on your current medications as directed. Please refer to the Current Medication list given to you today.  *If you need a refill on your cardiac medications before your next appointment, please call your pharmacy*   Lab Work: TODAY:  BMET & CBC  If you have labs (blood work) drawn today and your tests are completely normal, you will receive your results only by: MyChart Message (if you have MyChart) OR A paper copy in the mail If you have any lab test that is abnormal or we need to change your treatment, we will call you to review the results.   Testing/Procedures: None ordered   Follow-Up: At [facility], you and your health needs are our priority.  As part of our continuing mission to provide you with exceptional heart care, we have created designated Provider Care Teams.  These Care Teams include your primary Cardiologist (physician) and Advanced Practice Providers (APPs -  Physician Assistants and Nurse Practitioners) who all work together to provide you with the care you need, when you need it.  We recommend signing up for the patient portal called "MyChart".  Sign up information is provided on this After Visit Summary.  MyChart is used to connect with patients for Virtual Visits (Telemedicine).  Patients are able to view lab/test results, encounter notes, upcoming appointments, etc.  Non-urgent messages can be sent to your provider as well.   To learn more about what you can do with MyChart, go to [email or web address].    Your next appointment:   6 month(s)  The format for your next appointment:   In Person  Provider:   You may see Donato Schultz, MD or one of the following Advanced Practice Providers on your designated Care Team:   Nada Boozer, NP    Other Instructions We need to get a better idea of what your blood pressure is running at home. Here are some instructions to follow: - I would  recommend using a blood pressure cuff that goes on your arm. The wrist ones can be inaccurate. If you're purchasing one for the first time, try to select one that also reports your heart rate because this can be helpful information as well. - To check your blood pressure, choose a time at least 3 hours after taking your blood pressure medicines. If you can sample it at different times of the day, that's great - it might give you more information about how your blood pressure fluctuates. Remain seated in a chair for 5 minutes quietly beforehand, then check it.  - Please record a list of those readings and call us/send in MyChart message with them for our review in 3-4 days. Ideally we are looking for your goal blood pressure to be less than 130/80 consistently.

## 2021-02-20 IMAGING — CT CT ANGIO CHEST
2 of 6 series · 18 of 36 positions shown · IV contrast (omnipaque)
Comparison: 10/24/2018

CLINICAL DATA: Hypertension, tachycardia

EXAM:
CT ANGIOGRAPHY CHEST WITH CONTRAST
TECHNIQUE: Multidetector CT imaging of the chest was performed using the
standard protocol during bolus administration of intravenous
contrast. Multiplanar CT image reconstructions and MIPs were
obtained to evaluate the vascular anatomy.
CONTRAST:  100mL OMNIPAQUE IOHEXOL 350 MG/ML SOLN

[Series 5: thins · axial · 0.73mm/px · z∈[+1382,+1622]mm · 17 of 272 slices shown]
[im 16/272  lung]
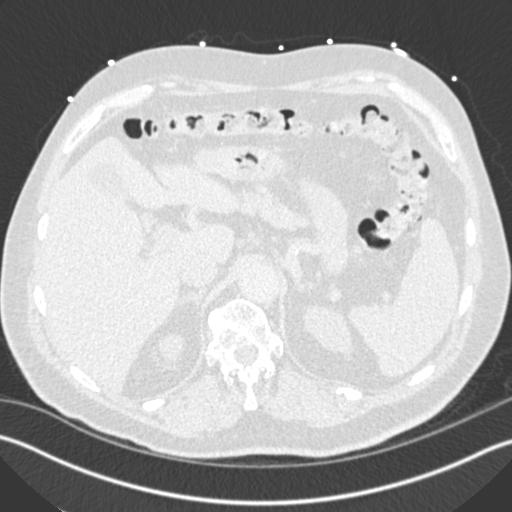
[im 31/272  mediastinal]
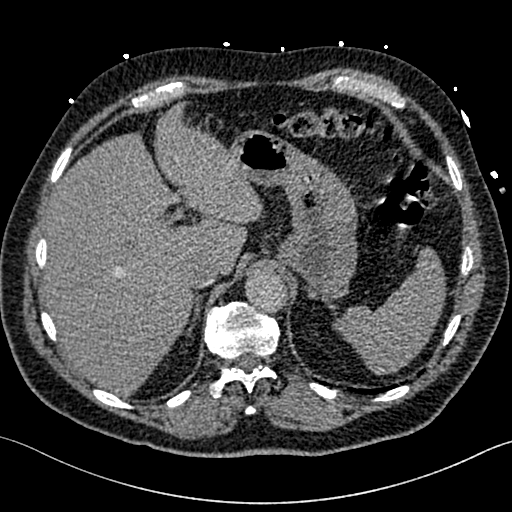
[im 46/272  lung]
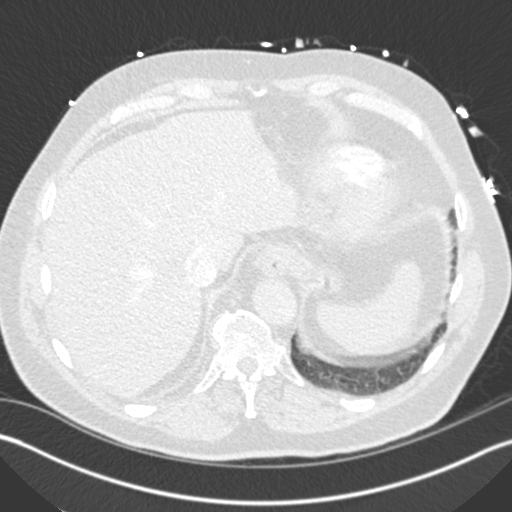
[im 61/272  mediastinal]
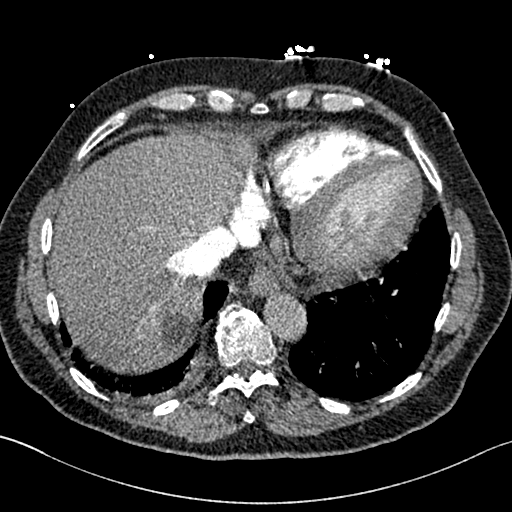
[im 76/272  lung]
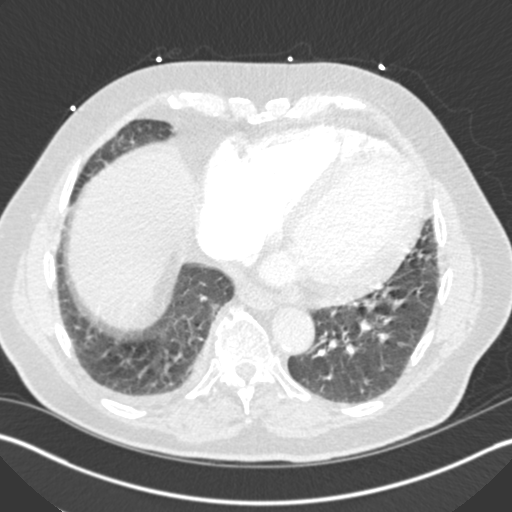
[im 91/272  mediastinal]
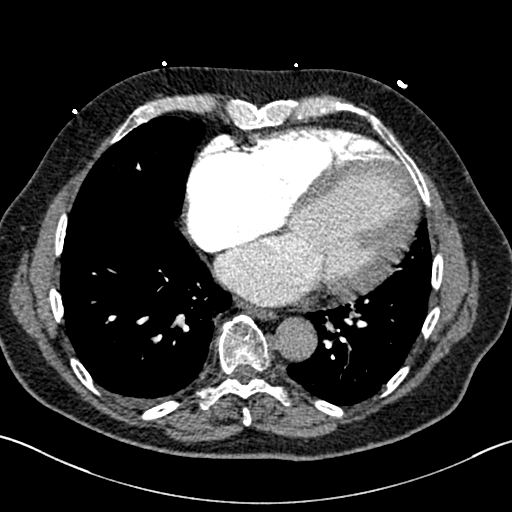
[im 106/272  lung]
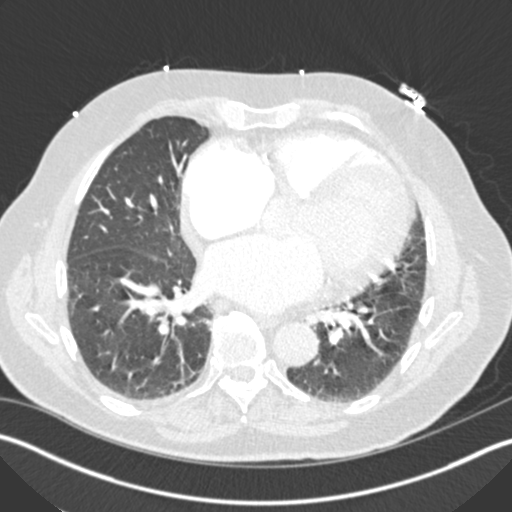
[im 121/272  mediastinal]
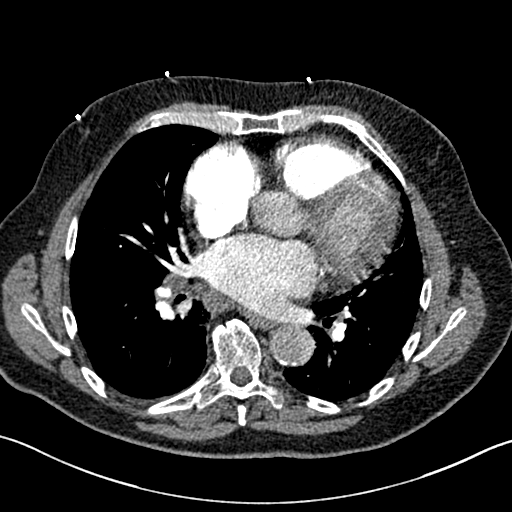
[im 136/272  lung]
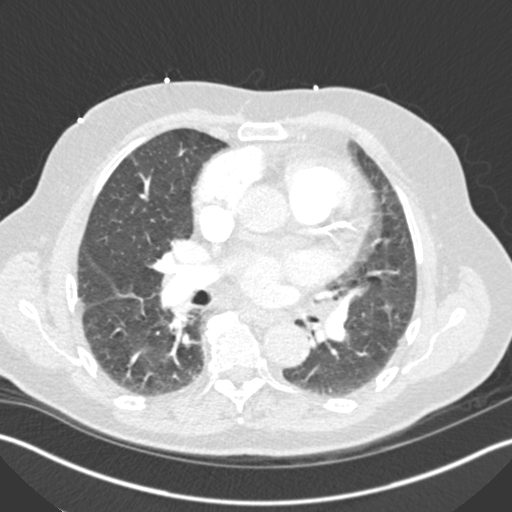
[im 151/272  mediastinal]
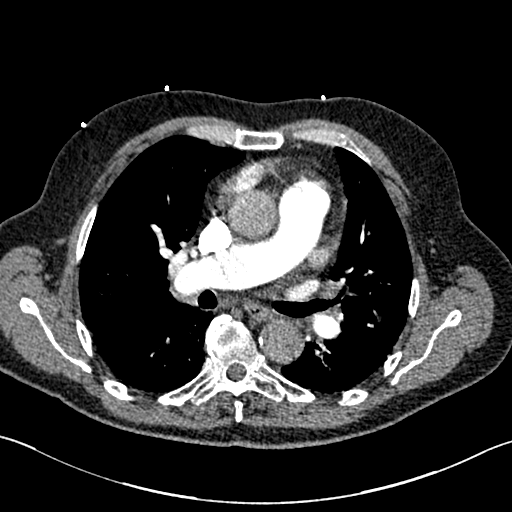
[im 166/272  lung]
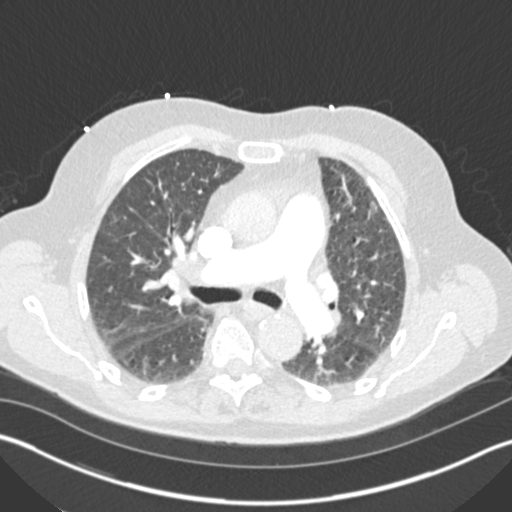
[im 181/272  mediastinal]
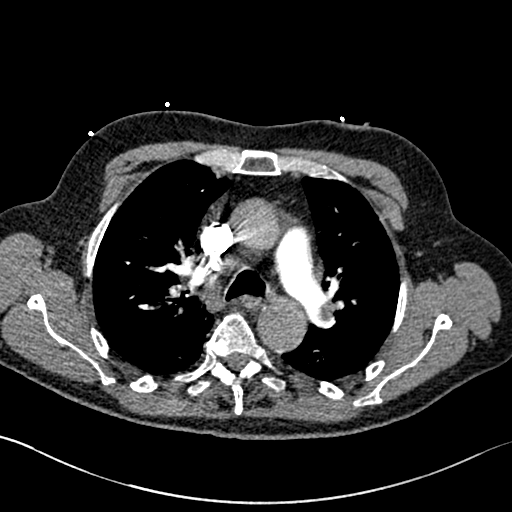
[im 196/272  lung]
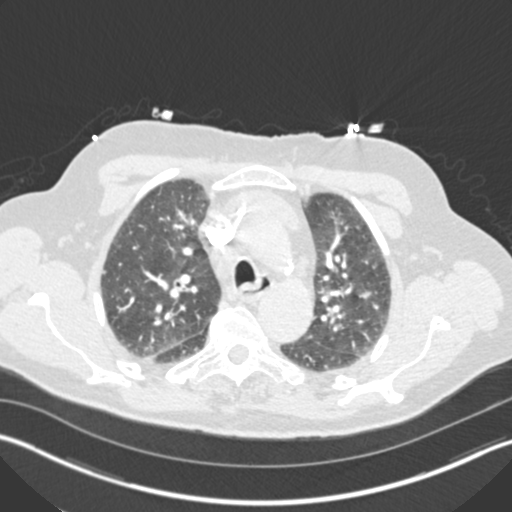
[im 211/272  mediastinal]
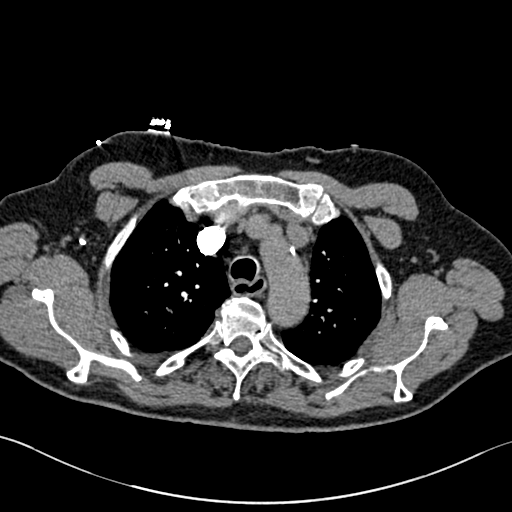
[im 226/272  lung]
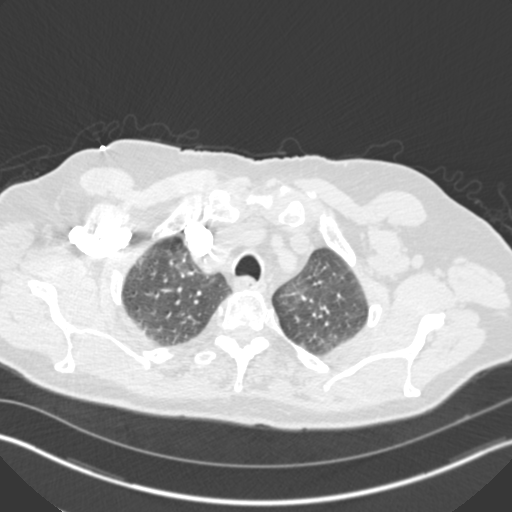
[im 241/272  mediastinal]
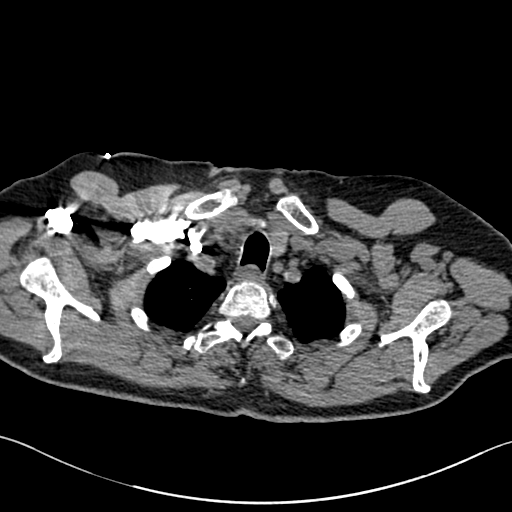
[im 256/272  lung]
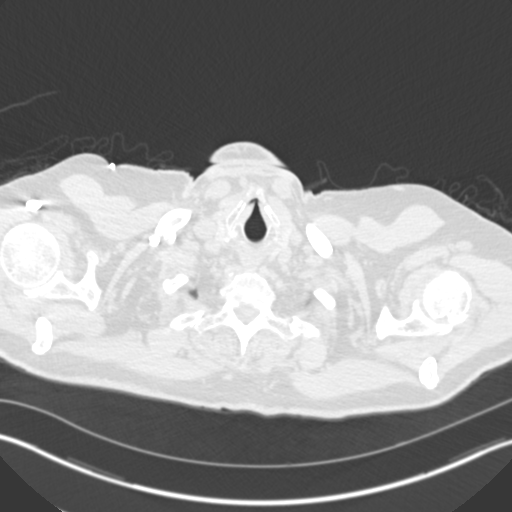

[Series 7: coronal mpr · coronal · 0.57mm/px · 1 of 140 slices shown]
[im 70/140  mediastinal]
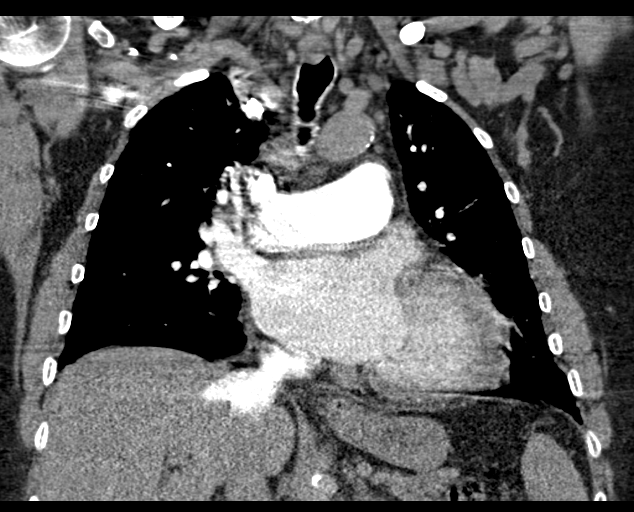

[18 of 36 positions shown; findings below may reference images not displayed]

FINDINGS: Cardiovascular: This is a technically adequate evaluation of the
pulmonary vasculature. There are no filling defects or pulmonary
emboli.

The heart is enlarged without pericardial effusion. Prominent
atherosclerosis is seen within the coronary vessels, greatest in the
LAD distribution. There is mild atherosclerosis of the thoracic
aorta. No evidence of aneurysm.

Mediastinum/Nodes: There are borderline enlarged mediastinal and
hilar lymph nodes. Largest lymph node in the right paratracheal
region image 41 measures 16 mm in short axis.

Lungs/Pleura: There is a trace right pleural effusion. No airspace
disease or pneumothorax. The central airways are patent.

Upper Abdomen: Hypodensity right lobe liver measures 2.2 cm
reference image 107, likely a cyst. The remainder of the upper
abdomen is unremarkable.

Musculoskeletal: There are no acute displaced fractures. Subacute to
chronic left posterior tenth and eleventh rib fractures are
identified. Reconstructed images demonstrate no additional findings.

Review of the MIP images confirms the above findings.
IMPRESSION: 1. No evidence of pulmonary embolus.
2. Trace right pleural effusion.
3. Borderline enlarged mediastinal lymph nodes, nonspecific.
4. Cardiomegaly.
5. Aortic Atherosclerosis ([id number].X).

## 2021-05-05 ENCOUNTER — Other Ambulatory Visit: Payer: Self-pay | Admitting: Cardiology

## 2021-06-16 ENCOUNTER — Other Ambulatory Visit: Payer: Self-pay | Admitting: Cardiology

## 2021-06-18 NOTE — Telephone Encounter (Signed)
Pt last saw Melina Copa, PA on 02/06/21, last labs 02/06/21 Creat 0.77, age 76, weight 100.5kg, based on specified criteria pt is on appropriate dosage of Eliquis 5mg  BID for afib.  Will refill rx.

## 2021-07-31 ENCOUNTER — Other Ambulatory Visit: Payer: Self-pay

## 2021-07-31 ENCOUNTER — Ambulatory Visit: Payer: No Typology Code available for payment source | Admitting: Cardiology

## 2021-07-31 ENCOUNTER — Encounter: Payer: Self-pay | Admitting: Cardiology

## 2021-07-31 VITALS — BP 158/68 | HR 68 | Ht 70.0 in | Wt 213.0 lb

## 2021-07-31 DIAGNOSIS — I2510 Atherosclerotic heart disease of native coronary artery without angina pectoris: Secondary | ICD-10-CM

## 2021-07-31 DIAGNOSIS — I4819 Other persistent atrial fibrillation: Secondary | ICD-10-CM | POA: Diagnosis not present

## 2021-07-31 DIAGNOSIS — Z79899 Other long term (current) drug therapy: Secondary | ICD-10-CM

## 2021-07-31 DIAGNOSIS — E7800 Pure hypercholesterolemia, unspecified: Secondary | ICD-10-CM

## 2021-07-31 DIAGNOSIS — I10 Essential (primary) hypertension: Secondary | ICD-10-CM

## 2021-07-31 DIAGNOSIS — D6869 Other thrombophilia: Secondary | ICD-10-CM | POA: Diagnosis not present

## 2021-07-31 DIAGNOSIS — I5042 Chronic combined systolic (congestive) and diastolic (congestive) heart failure: Secondary | ICD-10-CM

## 2021-07-31 DIAGNOSIS — I700 Atherosclerosis of aorta: Secondary | ICD-10-CM

## 2021-07-31 LAB — CBC
Hematocrit: 40.2 % (ref 37.5–51.0)
Hemoglobin: 13.5 g/dL (ref 13.0–17.7)
MCH: 31.2 pg (ref 26.6–33.0)
MCHC: 33.6 g/dL (ref 31.5–35.7)
MCV: 93 fL (ref 79–97)
Platelets: 183 10*3/uL (ref 150–450)
RBC: 4.33 x10E6/uL (ref 4.14–5.80)
RDW: 12.7 % (ref 11.6–15.4)
WBC: 6.7 10*3/uL (ref 3.4–10.8)

## 2021-07-31 LAB — BASIC METABOLIC PANEL
BUN/Creatinine Ratio: 18 (ref 10–24)
BUN: 15 mg/dL (ref 8–27)
CO2: 26 mmol/L (ref 20–29)
Calcium: 8.8 mg/dL (ref 8.6–10.2)
Chloride: 104 mmol/L (ref 96–106)
Creatinine, Ser: 0.85 mg/dL (ref 0.76–1.27)
Glucose: 108 mg/dL — ABNORMAL HIGH (ref 70–99)
Potassium: 4.3 mmol/L (ref 3.5–5.2)
Sodium: 142 mmol/L (ref 134–144)
eGFR: 90 mL/min/{1.73_m2} (ref >59)

## 2021-07-31 NOTE — Progress Notes (Signed)
Cardiology Office Note:    Date:  07/31/2021   ID:  Joshua Mejia, DOB 04-22-1946, MRN [id number]  PCP:  Ayesha Rumpf, FNP   [facility] Providers Cardiologist:  Donato Schultz, MD     Referring MD: Ayesha Rumpf, FNP    History of Present Illness:    Joshua Mejia is a 76 y.o. male here for follow up of:  Specialty Problems       Cardiology Problems   Aortic atherosclerosis [facility]   Coronary artery disease involving native coronary artery of native heart without angina pectoris    LAD calcification seen on CT, 07/2019      Essential hypertension   Persistent atrial fibrillation (HCC)    DCCV 08/2019 Asymptomatic       Pure hypercholesterolemia    Crestor 20 and Zetia 10      Chronic combined systolic and diastolic heart failure (HCC)    EF 45 prior to DCCV EF 55% post No spironolactone due to hyperkalemia        Chronic systolic HF - EF 83% most recent post DCCV 55%. Not on spironolactone due to high potassium.  PAF - dx 2021 after eye exam. DCCV 08/19/19, asymptomatic 1st degree AVB CAD (LAD calcification) Aortic atherosclerosis  Today, he is doing well with no major complaints. He is compliant with his medications. He is tolerating the Eliquis. His blood pressure at home after 3 hours of taking his medications is typically 130s systolic over 60 diastolic. Recently, he has not been recording his blood pressure. He stays active by walking.   He denies any chest pain, or shortness of breath, lightheadedness, headaches, syncope, orthopnea, PND, lower extremity edema or exertional symptoms.  Past Medical History:  Diagnosis Date   Aortic atherosclerosis (HCC)    Chronic combined systolic and diastolic CHF (congestive heart failure) (HCC)    Coronary artery calcification seen on CT scan    Essential hypertension    First degree AV block    Hyperkalemia    Hyperlipidemia LDL goal <70    PAF (paroxysmal atrial fibrillation) (HCC)     Past Surgical  History:  Procedure Laterality Date   CARDIOVERSION N/A 08/19/2019   Procedure: CARDIOVERSION;  Surgeon: Sande Rives, MD;  Location: [facility] ENDOSCOPY;  Service: Cardiovascular;  Laterality: N/A;   SHOULDER SURGERY      Current Medications: Current Meds  Medication Sig   ELIQUIS 5 MG TABS tablet TAKE 1 TABLET BY MOUTH TWICE A DAY   ENTRESTO 97-103 MG TAKE 1 TABLET BY MOUTH 2 (TWO) TIMES DAILY.   ezetimibe (ZETIA) 10 MG tablet TAKE 1 TABLET BY MOUTH EVERY DAY   metoprolol succinate (TOPROL-XL) 50 MG 24 hr tablet TAKE 1 TABLET (50 MG TOTAL) BY MOUTH 2 (TWO) TIMES DAILY. TAKE WITH OR IMMEDIATELY FOLLOWING A MEAL.   rosuvastatin (CRESTOR) 20 MG tablet Take 1 tablet (20 mg total) by mouth daily.     Allergies:   Patient has no known allergies.   Social History   Socioeconomic History   Marital status: Married    Spouse name: Not on file   Number of children: Not on file   Years of education: Not on file   Highest education level: Not on file  Occupational History   Not on file  Tobacco Use   Smoking status: Never   Smokeless tobacco: Never  Substance and Sexual Activity   Alcohol use: Not on file   Drug use: Not Currently   Sexual activity: Not  on file  Other Topics Concern   Not on file  Social History Narrative   Not on file   Social Determinants of Health   Financial Resource Strain: Not on file  Food Insecurity: Not on file  Transportation Needs: Not on file  Physical Activity: Not on file  Stress: Not on file  Social Connections: Not on file     Family History: The patient's family history includes Hypertension in his mother.  ROS:   Please see the history of present illness.    All other systems reviewed and are negative.  EKGs/Labs/Other Studies Reviewed:    The following studies were reviewed today: Echo 02/15/20 1. Left ventricular ejection fraction, by estimation, is 55%. The left  ventricle has normal function. The left ventricle has no regional  wall  motion abnormalities. Left ventricular diastolic parameters are consistent with Grade II diastolic dysfunction (pseudonormalization).   2. Right ventricular systolic function is normal. The right ventricular  size is normal. There is normal pulmonary artery systolic pressure. The estimated right ventricular systolic pressure is 26.2 mmHg.   3. Left atrial size was moderately dilated.   4. The mitral valve is normal in structure. Trivial mitral valve  regurgitation. No evidence of mitral stenosis.   5. The aortic valve is tricuspid. Aortic valve regurgitation is mild. No  aortic stenosis is present.   6. The inferior vena cava is normal in size with greater than 50%  respiratory variability, suggesting right atrial pressure of 3 mmHg.   Echo 09/02/19  1. Left ventricular ejection fraction, by estimation, is 40 to 45%. The  left ventricle has mildly decreased function. The left ventricle  demonstrates global hypokinesis. There is mild left ventricular  hypertrophy. Left ventricular diastolic parameters  are consistent with Grade III diastolic dysfunction (restrictive).  Elevated left atrial pressure.   2. Right ventricular systolic function is normal. The right ventricular  size is mildly enlarged. There is mildly elevated pulmonary artery  systolic pressure. The estimated right ventricular systolic pressure is  40.0 mmHg.   3. Left atrial size was moderately dilated.   4. The mitral valve is normal in structure. Mild mitral valve  regurgitation.   5. The aortic valve is tricuspid. Aortic valve regurgitation is mild. No  aortic stenosis is present.   6. The inferior vena cava is normal in size with greater than 50%  respiratory variability, suggesting right atrial pressure of 3 mmHg.   CTA Chest 07/20/19 IMPRESSION: 1. No evidence of pulmonary embolus. 2. Trace right pleural effusion. 3. Borderline enlarged mediastinal lymph nodes, nonspecific. 4. Cardiomegaly. 5. Aortic  Atherosclerosis (ICD10-I70.0).  Lower Venous DVT Study 07/20/19 RIGHT:  - No evidence of common femoral vein obstruction.     LEFT:  - There is no evidence of deep vein thrombosis in the lower extremity.     - No cystic structure found in the popliteal fossa.     *See table(s) above for measurements and observations.   EKG:  EKG was personally reviewed 07/31/21: Sinus rhythm first-degree AV block 65 bpm with PAC  02/06/21: NSR 69 bpm mild first degree AV-block  Recent Labs: 08/03/2020: ALT 25 02/06/2021: BUN 15; Creatinine, Ser 0.77; Hemoglobin 13.6; Platelets 172; Potassium 4.8; Sodium 139  Recent Lipid Panel    Component Value Date/Time   CHOL 132 08/03/2020 0854   TRIG 32 08/03/2020 0854   HDL 65 08/03/2020 0854   CHOLHDL 2.0 08/03/2020 0854   CHOLHDL 3.5 08/11/2019 0920   VLDL 8 08/11/2019  0920   LDLCALC 58 08/03/2020 0854     Risk Assessment/Calculations:              Physical Exam:    VS:  BP (!) 158/68 (BP Location: Left Arm, Patient Position: Sitting, Cuff Size: Normal)    Pulse 68    Ht 5\' 10"  (1.778 m)    Wt 213 lb (96.6 kg)    SpO2 97%    BMI 30.56 kg/m     Wt Readings from Last 3 Encounters:  07/31/21 213 lb (96.6 kg)  02/06/21 221 lb 9.6 oz (100.5 kg)  08/03/20 219 lb (99.3 kg)     GEN: Well nourished, well developed in no acute distress HEENT: Normal NECK: No JVD; No carotid bruits LYMPHATICS: No lymphadenopathy CARDIAC: Irregularly irregular, no murmurs, no rubs, gallops RESPIRATORY:  Clear to auscultation without rales, wheezing or rhonchi  ABDOMEN: Soft, non-tender, non-distended MUSCULOSKELETAL:  No edema; No deformity  SKIN: Warm and dry NEUROLOGIC:  Alert and oriented x 3 PSYCHIATRIC:  Normal affect   ASSESSMENT:    1. Medication management   2. Coronary artery disease involving native coronary artery of native heart without angina pectoris   3. Persistent atrial fibrillation (HCC)   4. Secondary hypercoagulable state (HCC)   5.  Chronic combined systolic and diastolic heart failure (HCC)   6. Pure hypercholesterolemia   7. Essential hypertension   8. Aortic atherosclerosis (HCC)    PLAN:    Chronic combined systolic and diastolic heart failure (HCC) Overall well compensated NYHA class I.  EF most recently 55%.  Continue with current medical management with high-dose Entresto refills as needed.  Also on metoprolol 50 mg twice a day.  This also helps with rate control of his atrial fibrillation.  He is not on spironolactone because of prior hyperkalemia.  Essential hypertension Elevated here in the office.  He has in the past checked his blood pressures at home repetitively and they had been in the 130 systolic range.  Continue with current medical management.  Pure hypercholesterolemia LDL 58 with Zetia and rosuvastatin.  Excellent.  Has coronary artery calcification.  Coronary artery disease involving native coronary artery of native heart without angina pectoris Prior LAD calcification seen on CT scan in 2021.  Continue with aggressive risk factor modification, LDL goal less than 70.  Excellent.  Secondary hypercoagulable state (HCC) Eliquis twice a day.  No bleeding.  Prior creatinine 0.7 hemoglobin 13.6.  Excellent.  TSH 2.1.  Aortic atherosclerosis (HCC) Seen on CT scan.  Continue with statin.  Not on aspirin because of Eliquis  Persistent atrial fibrillation (HCC) Today normal sinus rhythm.  PAC noted.  On exam sounded irregular.  Asymptomatic.   Medication Adjustments/Labs and Tests Ordered: Current medicines are reviewed at length with the patient today.  Concerns regarding medicines are outlined above.  Orders Placed This Encounter  Procedures   Basic metabolic panel   CBC   EKG 12-Lead   No orders of the defined types were placed in this encounter.   Patient Instructions  Medication Instructions:  The current medical regimen is effective;  continue present plan and medications.  *If you  need a refill on your cardiac medications before your next appointment, please call your pharmacy*  Lab Work: Please have blood work today  (CBC, BMP)  If you have labs (blood work) drawn today and your tests are completely normal, you will receive your results only by: MyChart Message (if you have MyChart) OR A paper copy  in the mail If you have any lab test that is abnormal or we need to change your treatment, we will call you to review the results.  Follow-Up: At [facility], you and your health needs are our priority.  As part of our continuing mission to provide you with exceptional heart care, we have created designated Provider Care Teams.  These Care Teams include your primary Cardiologist (physician) and Advanced Practice Providers (APPs -  Physician Assistants and Nurse Practitioners) who all work together to provide you with the care you need, when you need it.  We recommend signing up for the patient portal called "MyChart".  Sign up information is provided on this After Visit Summary.  MyChart is used to connect with patients for Virtual Visits (Telemedicine).  Patients are able to view lab/test results, encounter notes, upcoming appointments, etc.  Non-urgent messages can be sent to your provider as well.   To learn more about what you can do with MyChart, go to [email or web address].    Your next appointment:   6 month(s)  The format for your next appointment:   In Person  Provider:   Jari Favre, PA-C, Ronie Spies, PA-C, Jacolyn Reedy, PA-C, Eligha Bridegroom, NP, or Tereso Newcomer, PA-C     Then, Donato Schultz, MD will plan to see you again in 1 year(s).{  Thank you for choosing [facility]!!      I,Mykaella Javier,acting as a scribe for Donato Schultz, MD.,have documented all relevant documentation on the behalf of Donato Schultz, MD,as directed by  Donato Schultz, MD while in the presence of Donato Schultz, MD.  I, Donato Schultz, MD, have reviewed all documentation for  this visit. The documentation on 07/31/21 for the exam, diagnosis, procedures, and orders are all accurate and complete.   Signed, Donato Schultz, MD  07/31/2021 9:23 AM    [facility]

## 2021-07-31 NOTE — Assessment & Plan Note (Signed)
Elevated here in the office.  He has in the past checked his blood pressures at home repetitively and they had been in the 130 systolic range.  Continue with current medical management.

## 2021-07-31 NOTE — Progress Notes (Deleted)
Cardiology Office Note:    Date:  07/31/2021   ID:  SLY MEDOR, DOB 1945/11/25, MRN [id number]  PCP:  Christa See, FNP   [facility] Providers Cardiologist:  Candee Furbish, MD { Click to update primary MD,subspecialty MD or APP then REFRESH:1}    Referring MD: Christa See, FNP    History of Present Illness:    Joshua Mejia is a 76 y.o. male here for follow up of:  Specialty Problems       Cardiology Problems   Aortic atherosclerosis ([place])   Coronary artery disease involving native coronary artery of native heart without angina pectoris    LAD calcification seen on CT, 07/2019      Essential hypertension   Persistent atrial fibrillation ([place])    DCCV 08/2019 Asymptomatic       Pure hypercholesterolemia    Crestor 20 and Zetia 10      Chronic combined systolic and diastolic heart failure ([place])    EF 45 prior to DCCV EF 55% post No spironolactone due to hyperkalemia        Chronic systolic HF - EF [id number] most recent post DCCV 55%. Not on spironolactone due to high potassium.  PAF - dx 2021 after eye exam. DCCV 08/19/19, asymptomatic 1st degree AVB CAD (LAD calcification) Aortic atherosclerosis     Past Medical History:  Diagnosis Date   Aortic atherosclerosis (HCC)    Chronic combined systolic and diastolic CHF (congestive heart failure) (HCC)    Coronary artery calcification seen on CT scan    Essential hypertension    First degree AV block    Hyperkalemia    Hyperlipidemia LDL goal <70    PAF (paroxysmal atrial fibrillation) ([place])     Past Surgical History:  Procedure Laterality Date   CARDIOVERSION N/A 08/19/2019   Procedure: CARDIOVERSION;  Surgeon: Geralynn Rile, MD;  Location: [place];  Service: Cardiovascular;  Laterality: N/A;   SHOULDER SURGERY      Current Medications: Current Meds  Medication Sig   ELIQUIS 5 MG TABS tablet TAKE 1 TABLET BY MOUTH TWICE A DAY   ENTRESTO 97-103 MG TAKE 1 TABLET BY MOUTH 2 (TWO)  TIMES DAILY.   ezetimibe (ZETIA) 10 MG tablet TAKE 1 TABLET BY MOUTH EVERY DAY   metoprolol succinate (TOPROL-XL) 50 MG 24 hr tablet TAKE 1 TABLET (50 MG TOTAL) BY MOUTH 2 (TWO) TIMES DAILY. TAKE WITH OR IMMEDIATELY FOLLOWING A MEAL.   rosuvastatin (CRESTOR) 20 MG tablet Take 1 tablet (20 mg total) by mouth daily.     Allergies:   Patient has no known allergies.   Social History   Socioeconomic History   Marital status: Married    Spouse name: Not on file   Number of children: Not on file   Years of education: Not on file   Highest education level: Not on file  Occupational History   Not on file  Tobacco Use   Smoking status: Never   Smokeless tobacco: Never  Substance and Sexual Activity   Alcohol use: Not on file   Drug use: Not Currently   Sexual activity: Not on file  Other Topics Concern   Not on file  Social History Narrative   Not on file   Social Determinants of Health   Financial Resource Strain: Not on file  Food Insecurity: Not on file  Transportation Needs: Not on file  Physical Activity: Not on file  Stress: Not on file  Social Connections: Not on file  Family History: The patient's ***family history includes Hypertension in his mother.  ROS:   Please see the history of present illness.    *** All other systems reviewed and are negative.  EKGs/Labs/Other Studies Reviewed:    The following studies were reviewed today: ***  EKG:  EKG is *** ordered today.  The ekg ordered today demonstrates ***  Recent Labs: 08/03/2020: ALT 25 02/06/2021: BUN 15; Creatinine, Ser 0.77; Hemoglobin 13.6; Platelets 172; Potassium 4.8; Sodium 139  Recent Lipid Panel    Component Value Date/Time   CHOL 132 08/03/2020 0854   TRIG 32 08/03/2020 0854   HDL 65 08/03/2020 0854   CHOLHDL 2.0 08/03/2020 0854   CHOLHDL 3.5 08/11/2019 0920   VLDL 8 08/11/2019 0920   LDLCALC 58 08/03/2020 0854     Risk Assessment/Calculations:   {Does this patient have ATRIAL  FIBRILLATION?:[phone]}           Physical Exam:    VS:  BP (!) 158/68 (BP Location: Left Arm, Patient Position: Sitting, Cuff Size: Normal)    Pulse 68    Ht 5\' 10"  (1.778 m)    Wt 213 lb (96.6 kg)    SpO2 97%    BMI 30.56 kg/m     Wt Readings from Last 3 Encounters:  07/31/21 213 lb (96.6 kg)  02/06/21 221 lb 9.6 oz (100.5 kg)  08/03/20 219 lb (99.3 kg)     GEN: *** Well nourished, well developed in no acute distress HEENT: Normal NECK: No JVD; No carotid bruits LYMPHATICS: No lymphadenopathy CARDIAC: ***RRR, no murmurs, no rubs, gallops RESPIRATORY:  Clear to auscultation without rales, wheezing or rhonchi  ABDOMEN: Soft, non-tender, non-distended MUSCULOSKELETAL:  No edema; No deformity  SKIN: Warm and dry NEUROLOGIC:  Alert and oriented x 3 PSYCHIATRIC:  Normal affect   ASSESSMENT:    1. Coronary artery disease involving native coronary artery of native heart without angina pectoris   2. Persistent atrial fibrillation ([place])   3. Secondary hypercoagulable state ([place])   4. Chronic combined systolic and diastolic heart failure ([place])   5. Pure hypercholesterolemia    PLAN:    In order of problems listed above:  No problem-specific Assessment & Plan notes found for this encounter.     {Are you ordering a CV Procedure (e.g. stress test, cath, DCCV, TEE, etc)?   Press F2        :[id number]    Medication Adjustments/Labs and Tests Ordered: Current medicines are reviewed at length with the patient today.  Concerns regarding medicines are outlined above.  No orders of the defined types were placed in this encounter.  No orders of the defined types were placed in this encounter.   There are no Patient Instructions on file for this visit.   Signed, Candee Furbish, MD  07/31/2021 8:56 AM    [facility]

## 2021-07-31 NOTE — Patient Instructions (Signed)
Medication Instructions:  The current medical regimen is effective;  continue present plan and medications.  *If you need a refill on your cardiac medications before your next appointment, please call your pharmacy*  Lab Work: Please have blood work today  (CBC, BMP)  If you have labs (blood work) drawn today and your tests are completely normal, you will receive your results only by: MyChart Message (if you have MyChart) OR A paper copy in the mail If you have any lab test that is abnormal or we need to change your treatment, we will call you to review the results.  Follow-Up: At [facility], you and your health needs are our priority.  As part of our continuing mission to provide you with exceptional heart care, we have created designated Provider Care Teams.  These Care Teams include your primary Cardiologist (physician) and Advanced Practice Providers (APPs -  Physician Assistants and Nurse Practitioners) who all work together to provide you with the care you need, when you need it.  We recommend signing up for the patient portal called "MyChart".  Sign up information is provided on this After Visit Summary.  MyChart is used to connect with patients for Virtual Visits (Telemedicine).  Patients are able to view lab/test results, encounter notes, upcoming appointments, etc.  Non-urgent messages can be sent to your provider as well.   To learn more about what you can do with MyChart, go to [email or web address].    Your next appointment:   6 month(s)  The format for your next appointment:   In Person  Provider:   Jari Favre, PA-C, Ronie Spies, PA-C, Jacolyn Reedy, PA-C, Eligha Bridegroom, NP, or Tereso Newcomer, PA-C     Then, Donato Schultz, MD will plan to see you again in 1 year(s).{  Thank you for choosing [facility]!!

## 2021-07-31 NOTE — Assessment & Plan Note (Signed)
LDL 58 with Zetia and rosuvastatin.  Excellent.  Has coronary artery calcification.

## 2021-07-31 NOTE — Assessment & Plan Note (Signed)
Today normal sinus rhythm.  PAC noted.  On exam sounded irregular.  Asymptomatic.

## 2021-07-31 NOTE — Assessment & Plan Note (Signed)
Eliquis twice a day.  No bleeding.  Prior creatinine 0.7 hemoglobin 13.6.  Excellent.  TSH 2.1.

## 2021-07-31 NOTE — Assessment & Plan Note (Signed)
Overall well compensated NYHA class I.  EF most recently 55%.  Continue with current medical management with high-dose Entresto refills as needed.  Also on metoprolol 50 mg twice a day.  This also helps with rate control of his atrial fibrillation.  He is not on spironolactone because of prior hyperkalemia.

## 2021-07-31 NOTE — Assessment & Plan Note (Signed)
Seen on CT scan.  Continue with statin.  Not on aspirin because of Eliquis

## 2021-07-31 NOTE — Assessment & Plan Note (Signed)
Prior LAD calcification seen on CT scan in 2021.  Continue with aggressive risk factor modification, LDL goal less than 70.  Excellent.

## 2021-08-06 ENCOUNTER — Encounter: Payer: Self-pay | Admitting: *Deleted

## 2021-09-25 DIAGNOSIS — T25322A Burn of third degree of left foot, initial encounter: Secondary | ICD-10-CM | POA: Insufficient documentation

## 2021-09-25 DIAGNOSIS — X19XXXA Contact with other heat and hot substances, initial encounter: Secondary | ICD-10-CM | POA: Insufficient documentation

## 2021-10-06 ENCOUNTER — Other Ambulatory Visit (HOSPITAL_COMMUNITY): Payer: Self-pay | Admitting: Cardiology

## 2021-10-20 ENCOUNTER — Other Ambulatory Visit: Payer: Self-pay | Admitting: Cardiology

## 2021-10-20 DIAGNOSIS — I4819 Other persistent atrial fibrillation: Secondary | ICD-10-CM

## 2021-10-22 NOTE — Telephone Encounter (Signed)
Eliquis 5mg  refill request received. Patient is 76 years old, weight-96.6kg, Crea-0.85 on 07/31/2021, Diagnosis-Afib, and last seen by Dr. Marlou Porch on 07/31/2021. Dose is appropriate based on dosing criteria. Will send in refill to requested pharmacy.   ?

## 2021-10-29 ENCOUNTER — Other Ambulatory Visit: Payer: Self-pay

## 2021-10-29 MED ORDER — ENTRESTO 97-103 MG PO TABS: 1.0000 | ORAL_TABLET | Freq: Two times a day (BID) | ORAL | 6 refills | Status: DC

## 2021-10-30 ENCOUNTER — Telehealth: Payer: Self-pay | Admitting: Cardiology

## 2021-10-30 NOTE — Telephone Encounter (Signed)
Patient called stating he has a dental appt for tomorrow, he is having two teeth extracted. He wants to know if he needs to stop his eliquis.

## 2021-10-30 NOTE — Telephone Encounter (Signed)
Pt is aware of instructions and no need to interrupt blood thinner therapy.  Clearance faxed to [facility] [phone]

## 2021-10-30 NOTE — Telephone Encounter (Signed)
   Primary Cardiologist: Candee Furbish, MD  Chart reviewed as part of pre-operative protocol coverage. Simple dental extractions are considered low risk procedures per guidelines and generally do not require any specific cardiac clearance. It is also generally accepted that for simple extractions and dental cleanings, there is no need to interrupt blood thinner therapy.   SBE prophylaxis is not required for the patient.  I will route this recommendation to the requesting party via Epic fax function and remove from pre-op pool.  Please call with questions.  Emmaline Life, NP-C    10/30/2021, 11:33 AM [place] [id number] Office [phone] Fax [phone]

## 2021-12-03 ENCOUNTER — Telehealth: Payer: Self-pay | Admitting: Cardiology

## 2021-12-03 MED ORDER — ENTRESTO 97-103 MG PO TABS: 1.0000 | ORAL_TABLET | Freq: Two times a day (BID) | ORAL | 8 refills | Status: DC

## 2022-01-27 ENCOUNTER — Other Ambulatory Visit: Payer: Self-pay | Admitting: Physician Assistant

## 2022-01-27 ENCOUNTER — Other Ambulatory Visit: Payer: Self-pay | Admitting: Cardiology

## 2022-01-31 ENCOUNTER — Telehealth: Payer: Self-pay | Admitting: Cardiology

## 2022-01-31 NOTE — Telephone Encounter (Signed)
Returned call to patient and informed him he is to continue taking Ezetimibe 10mg  QD.  Patient states he needs a refill sent in to [facility]. An e-prescription was sent to [facility] and confirmed receipt on 01/28/22 at 2:37 PM. Advised patient to call [facility] to see if Rx is waiting for him to pick-up. Patient states he will call and will let 01/30/22 know if he needs Rx resent.

## 2022-01-31 NOTE — Telephone Encounter (Signed)
Pt c/o medication issue:  1. Name of Medication: Ezetimibe  2. How are you currently taking this medication (dosage and times per day)?  1 tablet a day  3. Are you having a reaction (difficulty breathing--STAT)?   4. What is your medication issue? What to know if he is supposed to continue taking this medicine? If so, he will need a refill

## 2022-02-04 ENCOUNTER — Ambulatory Visit: Payer: No Typology Code available for payment source | Attending: Physician Assistant | Admitting: Physician Assistant

## 2022-02-04 ENCOUNTER — Encounter: Payer: Self-pay | Admitting: Physician Assistant

## 2022-02-04 VITALS — BP 175/90 | HR 63 | Ht 71.0 in | Wt 212.0 lb

## 2022-02-04 DIAGNOSIS — I440 Atrioventricular block, first degree: Secondary | ICD-10-CM | POA: Diagnosis not present

## 2022-02-04 DIAGNOSIS — I5042 Chronic combined systolic (congestive) and diastolic (congestive) heart failure: Secondary | ICD-10-CM

## 2022-02-04 DIAGNOSIS — I2584 Coronary atherosclerosis due to calcified coronary lesion: Secondary | ICD-10-CM

## 2022-02-04 DIAGNOSIS — I2510 Atherosclerotic heart disease of native coronary artery without angina pectoris: Secondary | ICD-10-CM | POA: Diagnosis not present

## 2022-02-04 DIAGNOSIS — I700 Atherosclerosis of aorta: Secondary | ICD-10-CM

## 2022-02-04 DIAGNOSIS — I10 Essential (primary) hypertension: Secondary | ICD-10-CM

## 2022-02-04 DIAGNOSIS — E785 Hyperlipidemia, unspecified: Secondary | ICD-10-CM

## 2022-02-04 DIAGNOSIS — I480 Paroxysmal atrial fibrillation: Secondary | ICD-10-CM

## 2022-02-04 NOTE — Progress Notes (Signed)
Cardiology Office Note    Date:  02/04/2022   ID:  Joshua Mejia, DOB 03-03-1946, MRN [id number]  PCP:  Ayesha Rumpf, FNP  Cardiologist:  Donato Schultz, MD  Electrophysiologist:  None   Chief Complaint: f/u afib  History of Present Illness:   Joshua Mejia is a 76 y.o. male with history of paroxysmal atrial fib, chronic combined CHF, first degree AVB, coronary artery calcification and aortic atherosclerosis by prior CT (LAD per Dr. Anne Fu' review), HTN, HLD who presents for cardiology f/u. He was initially diagnosed with afib in 2021. He had gone for an eye exam and had findings that he was told may represent HTN or DM, so he followed up with his GP who incidentally found atrial fib. He was asymptomatic at the time. 2D echo 08/2019 showed mildly decreased EF 40-45%. He underwent DCCV 08/19/19. F/u echo 02/2020 EF 55%, g2DD, normal Rv, mod LAE. No prior ischemic eval. He previously could not use spironolactone due to baseline hyperkalemia that occurred both spuriously in 2021 and 2022.  He is seen for follow-up doing well. He continues to work. He denies any cardiac complaints. No recent CP, SOB, palpitations, syncope or bleeding. His home BPs have been running 130s-150s. He drinks 2-3 beers daily. He repots he is doing "just fine." He had breakfast but has not had lunch yet today.  Labwork independently reviewed: 07/2021 CBC wnl, K 4.3, Cr 0.85 07/2020 LFTs OK, LDL 58 2021 TSH wnl   Cardiology Studies:   Studies reviewed are outlined and summarized above. Reports included below if pertinent.   2D Echo 02/2020  IMPRESSIONS     1. Left ventricular ejection fraction, by estimation, is 55%. The left  ventricle has normal function. The left ventricle has no regional wall  motion abnormalities. Left ventricular diastolic parameters are consistent  with Grade II diastolic dysfunction  (pseudonormalization).   2. Right ventricular systolic function is normal. The right ventricular   size is normal. There is normal pulmonary artery systolic pressure. The  estimated right ventricular systolic pressure is 26.2 mmHg.   3. Left atrial size was moderately dilated.   4. The mitral valve is normal in structure. Trivial mitral valve  regurgitation. No evidence of mitral stenosis.   5. The aortic valve is tricuspid. Aortic valve regurgitation is mild. No  aortic stenosis is present.   6. The inferior vena cava is normal in size with greater than 50%  respiratory variability, suggesting right atrial pressure of 3 mmHg.     Past Medical History:  Diagnosis Date   Aortic atherosclerosis (HCC)    Chronic combined systolic and diastolic CHF (congestive heart failure) (HCC)    Coronary artery calcification seen on CT scan    Essential hypertension    First degree AV block    Hyperkalemia    Hyperlipidemia LDL goal <70    PAF (paroxysmal atrial fibrillation) (HCC)     Past Surgical History:  Procedure Laterality Date   CARDIOVERSION N/A 08/19/2019   Procedure: CARDIOVERSION;  Surgeon: Sande Rives, MD;  Location: [facility] ENDOSCOPY;  Service: Cardiovascular;  Laterality: N/A;   SHOULDER SURGERY      Current Medications: Current Meds  Medication Sig   apixaban (ELIQUIS) 5 MG TABS tablet TAKE 1 TABLET BY MOUTH TWICE A DAY   ezetimibe (ZETIA) 10 MG tablet TAKE 1 TABLET BY MOUTH EVERY DAY   Glucosamine 500 MG CAPS 2 tablets daily.   metoprolol succinate (TOPROL-XL) 50 MG 24 hr tablet TAKE  1 TABLET (50 MG TOTAL) BY MOUTH 2 (TWO) TIMES DAILY. TAKE WITH OR IMMEDIATELY FOLLOWING A MEAL.   rosuvastatin (CRESTOR) 20 MG tablet TAKE 1 TABLET BY MOUTH EVERY DAY   sacubitril-valsartan (ENTRESTO) 97-103 MG Take 1 tablet by mouth 2 (two) times daily.      Allergies:   Patient has no known allergies.   Social History   Socioeconomic History   Marital status: Married    Spouse name: Not on file   Number of children: Not on file   Years of education: Not on file   Highest  education level: Not on file  Occupational History   Not on file  Tobacco Use   Smoking status: Never   Smokeless tobacco: Never  Substance and Sexual Activity   Alcohol use: Not on file   Drug use: Not Currently   Sexual activity: Not on file  Other Topics Concern   Not on file  Social History Narrative   Not on file   Social Determinants of Health   Financial Resource Strain: Not on file  Food Insecurity: Not on file  Transportation Needs: Not on file  Physical Activity: Not on file  Stress: Not on file  Social Connections: Not on file     Family History:  The patient's family history includes Hypertension in his mother.  ROS:   Please see the history of present illness.  All other systems are reviewed and otherwise negative.    EKG(s)/Additional Labs   EKG:  EKG is ordered today, personally reviewed, demonstrating NSR 63bpm first degree AVB, ILBBB, no acute change from prior.  Recent Labs: 07/31/2021: BUN 15; Creatinine, Ser 0.85; Hemoglobin 13.5; Platelets 183; Potassium 4.3; Sodium 142  Recent Lipid Panel    Component Value Date/Time   CHOL 132 08/03/2020 0854   TRIG 32 08/03/2020 0854   HDL 65 08/03/2020 0854   CHOLHDL 2.0 08/03/2020 0854   CHOLHDL 3.5 08/11/2019 0920   VLDL 8 08/11/2019 0920   LDLCALC 58 08/03/2020 0854    PHYSICAL EXAM:    VS:  BP (!) 152/80   Pulse 63   Ht 5\' 11"  (1.803 m)   Wt 212 lb (96.2 kg)   SpO2 99%   BMI 29.57 kg/m   BMI: Body mass index is 29.57 kg/m.  GEN: Well nourished, well developed male in no acute distress HEENT: normocephalic, atraumatic Neck: no JVD, carotid bruits, or masses Cardiac: RRR; no murmurs, rubs, or gallops, no edema  Respiratory:  clear to auscultation bilaterally, normal work of breathing GI: soft, nontender, nondistended, + BS MS: no deformity or atrophy Skin: warm and dry, no rash Neuro:  Alert and Oriented x 3, Strength and sensation are intact, follows commands. Hard of hearing Psych:  euthymic mood, full affect  Wt Readings from Last 3 Encounters:  02/04/22 212 lb (96.2 kg)  07/31/21 213 lb (96.6 kg)  02/06/21 221 lb 9.6 oz (100.5 kg)    HYPERTENSION CONTROL Vitals:   02/04/22 1529 02/04/22 1616  BP: (!) 152/80 (!) 175/90    The patient's blood pressure is elevated above target today.  In order to address the patient's elevated BP: A new medication will be prescribed contigent on labs.       ASSESSMENT & PLAN:   1. Paroxysmal atrial fibrillation, first degree AVB - denies any recent breakthrough AF. Continue Eliquis, metoprolol. Update labs. Denies any evidence of bleeding. We again reviewed reduction of ETOH as this increases risk of GI bleeding. He has first  degree AVB and ILBBB on EKG similar to prior, with no symptoms to suggest bradycardia.  2. Chronic combined CHF - appears euvolemic. Given no interim symptoms and otherwise tolerating medicines well, no need to repeat echo at present time, but would update if any breakthrough symptoms. Continue Toprol, Entresto. Not on spironolactone due to h/o intermittent hyperkalemia. We do need better BP control. See below.  3. Essential HTN - Suboptimal blood pressure control noted. Will check labs today (lytes/Cr/TSH) and advise further on medication recommendation - considering thiazide diuretic depending on K, Cr. Amlodipine or hydralazine would be other options. Otherwise continue Entresto and metoprolol. I considered changing metoprolol to carvedilol but given his h/o PAF and that he's done so well on this agent, hesitant to rock that boat.  4. Coronary calcification and aortic atherosclerosis by CT with hyperlipidemia - not on ASA due to concomitant Eliquis. No recent anginal symptoms. Continue rosuvastatin, ezetimibe, and update CMET/lipid profile today.     Disposition: F/u with me in 4 weeks to f/u BP.   Medication Adjustments/Labs and Tests Ordered: Current medicines are reviewed at length with the patient  today.  Concerns regarding medicines are outlined above. Medication changes, Labs and Tests ordered today are summarized above and listed in the Patient Instructions accessible in Encounters.   Signed, Charlie Pitter, PA-C  02/04/2022 4:12 PM    Page Phone: [phone]; Fax: [phone]

## 2022-02-04 NOTE — Patient Instructions (Signed)
Medication Instructions:  Your physician recommends that you continue on your current medications as directed. Please refer to the Current Medication list given to you today. *If you need a refill on your cardiac medications before your next appointment, please call your pharmacy*   Lab Work: TODAY-CMET, LIPIDS, CBC, TSH If you have labs (blood work) drawn today and your tests are completely normal, you will receive your results only by: MyChart Message (if you have MyChart) OR A paper copy in the mail If you have any lab test that is abnormal or we need to change your treatment, we will call you to review the results.   Testing/Procedures: NONE ORDERED   Follow-Up: At [facility], you and your health needs are our priority.  As part of our continuing mission to provide you with exceptional heart care, we have created designated Provider Care Teams.  These Care Teams include your primary Cardiologist (physician) and Advanced Practice Providers (APPs -  Physician Assistants and Nurse Practitioners) who all work together to provide you with the care you need, when you need it.  We recommend signing up for the patient portal called "MyChart".  Sign up information is provided on this After Visit Summary.  MyChart is used to connect with patients for Virtual Visits (Telemedicine).  Patients are able to view lab/test results, encounter notes, upcoming appointments, etc.  Non-urgent messages can be sent to your provider as well.   To learn more about what you can do with MyChart, go to [email or web address].    Your next appointment:   4 week(s)  The format for your next appointment:   In Person  Provider:   Ronie Spies, PA-C        Other Instructions   Important Information About Sugar

## 2022-02-05 LAB — CBC
Hematocrit: 39.9 % (ref 37.5–51.0)
Hemoglobin: 13.2 g/dL (ref 13.0–17.7)
MCH: 30.9 pg (ref 26.6–33.0)
MCHC: 33.1 g/dL (ref 31.5–35.7)
MCV: 93 fL (ref 79–97)
Platelets: 159 10*3/uL (ref 150–450)
RBC: 4.27 x10E6/uL (ref 4.14–5.80)
RDW: 13.1 % (ref 11.6–15.4)
WBC: 5.1 10*3/uL (ref 3.4–10.8)

## 2022-02-05 LAB — COMPREHENSIVE METABOLIC PANEL
ALT: 19 IU/L (ref 0–44)
AST: 24 IU/L (ref 0–40)
Albumin/Globulin Ratio: 2 (ref 1.2–2.2)
Albumin: 4.3 g/dL (ref 3.8–4.8)
Alkaline Phosphatase: 57 IU/L (ref 44–121)
BUN/Creatinine Ratio: 18 (ref 10–24)
BUN: 15 mg/dL (ref 8–27)
Bilirubin Total: 0.7 mg/dL (ref 0.0–1.2)
CO2: 24 mmol/L (ref 20–29)
Calcium: 9.1 mg/dL (ref 8.6–10.2)
Chloride: 105 mmol/L (ref 96–106)
Creatinine, Ser: 0.82 mg/dL (ref 0.76–1.27)
Globulin, Total: 2.2 g/dL (ref 1.5–4.5)
Glucose: 87 mg/dL (ref 70–99)
Potassium: 4.3 mmol/L (ref 3.5–5.2)
Sodium: 142 mmol/L (ref 134–144)
Total Protein: 6.5 g/dL (ref 6.0–8.5)
eGFR: 91 mL/min/{1.73_m2} (ref >59)

## 2022-02-05 LAB — LIPID PANEL
Chol/HDL Ratio: 1.8 ratio (ref 0.0–5.0)
Cholesterol, Total: 135 mg/dL (ref 100–199)
HDL: 77 mg/dL (ref >39)
LDL Chol Calc (NIH): 48 mg/dL (ref 0–99)
Triglycerides: 39 mg/dL (ref 0–149)
VLDL Cholesterol Cal: 10 mg/dL (ref 5–40)

## 2022-02-05 LAB — TSH: TSH: 2.54 u[IU]/mL (ref 0.450–4.500)

## 2022-02-06 ENCOUNTER — Telehealth: Payer: Self-pay

## 2022-02-06 DIAGNOSIS — I5022 Chronic systolic (congestive) heart failure: Secondary | ICD-10-CM

## 2022-02-06 MED ORDER — CHLORTHALIDONE 25 MG PO TABS: 12.5000 mg | ORAL_TABLET | Freq: Every day | ORAL | 2 refills | Status: DC

## 2022-02-06 NOTE — Telephone Encounter (Signed)
Orders placed from result note per Ronie Spies.

## 2022-02-13 ENCOUNTER — Ambulatory Visit: Payer: No Typology Code available for payment source | Attending: Cardiology

## 2022-02-13 DIAGNOSIS — I5022 Chronic systolic (congestive) heart failure: Secondary | ICD-10-CM

## 2022-02-13 LAB — BASIC METABOLIC PANEL
BUN/Creatinine Ratio: 24 (ref 10–24)
BUN: 23 mg/dL (ref 8–27)
CO2: 24 mmol/L (ref 20–29)
Calcium: 9.2 mg/dL (ref 8.6–10.2)
Chloride: 102 mmol/L (ref 96–106)
Creatinine, Ser: 0.94 mg/dL (ref 0.76–1.27)
Glucose: 108 mg/dL — ABNORMAL HIGH (ref 70–99)
Potassium: 5.3 mmol/L — ABNORMAL HIGH (ref 3.5–5.2)
Sodium: 138 mmol/L (ref 134–144)
eGFR: 84 mL/min/{1.73_m2} (ref >59)

## 2022-02-14 ENCOUNTER — Telehealth: Payer: Self-pay | Admitting: Cardiology

## 2022-02-14 ENCOUNTER — Other Ambulatory Visit: Payer: Self-pay

## 2022-02-14 DIAGNOSIS — I440 Atrioventricular block, first degree: Secondary | ICD-10-CM

## 2022-02-14 DIAGNOSIS — I2510 Atherosclerotic heart disease of native coronary artery without angina pectoris: Secondary | ICD-10-CM

## 2022-02-14 DIAGNOSIS — I480 Paroxysmal atrial fibrillation: Secondary | ICD-10-CM

## 2022-02-14 DIAGNOSIS — E875 Hyperkalemia: Secondary | ICD-10-CM

## 2022-02-14 DIAGNOSIS — I5042 Chronic combined systolic (congestive) and diastolic (congestive) heart failure: Secondary | ICD-10-CM

## 2022-02-14 DIAGNOSIS — I5022 Chronic systolic (congestive) heart failure: Secondary | ICD-10-CM

## 2022-02-14 NOTE — Telephone Encounter (Signed)
See result note. Patient notified directly.

## 2022-02-14 NOTE — Telephone Encounter (Signed)
Patient is returning call to discuss lab results. 

## 2022-02-15 ENCOUNTER — Ambulatory Visit: Payer: No Typology Code available for payment source | Attending: Cardiology

## 2022-02-15 DIAGNOSIS — I2510 Atherosclerotic heart disease of native coronary artery without angina pectoris: Secondary | ICD-10-CM

## 2022-02-15 DIAGNOSIS — I5042 Chronic combined systolic (congestive) and diastolic (congestive) heart failure: Secondary | ICD-10-CM

## 2022-02-15 DIAGNOSIS — I440 Atrioventricular block, first degree: Secondary | ICD-10-CM

## 2022-02-15 DIAGNOSIS — E875 Hyperkalemia: Secondary | ICD-10-CM

## 2022-02-15 DIAGNOSIS — I5022 Chronic systolic (congestive) heart failure: Secondary | ICD-10-CM

## 2022-02-15 DIAGNOSIS — I480 Paroxysmal atrial fibrillation: Secondary | ICD-10-CM

## 2022-02-16 ENCOUNTER — Telehealth: Payer: Self-pay | Admitting: Student

## 2022-02-16 DIAGNOSIS — I10 Essential (primary) hypertension: Secondary | ICD-10-CM

## 2022-02-16 DIAGNOSIS — E875 Hyperkalemia: Secondary | ICD-10-CM

## 2022-02-16 LAB — POTASSIUM: Potassium: 6 mmol/L (ref 3.5–5.2)

## 2022-02-16 MED ORDER — LOKELMA 10 G PO PACK: 10.0000 g | PACK | Freq: Once | ORAL | 0 refills | Status: AC

## 2022-02-16 MED ORDER — CHLORTHALIDONE 25 MG PO TABS: 25.0000 mg | ORAL_TABLET | Freq: Every day | ORAL | 2 refills | Status: DC

## 2022-02-16 NOTE — Telephone Encounter (Addendum)
   Received sign-out from overnight fellow of critical outpatient potassium level of 6.0 on lab drawn yesterday. Fellow tried to call patient with no answer. I have tried to call patient twice this morning with no answer. Unable to leave voice mail either time. Will try to call back later today.  Plan will be to stop Entresto and give one dose of Lokelma 10g. His BP was elevated at 175/90 at recent office visit on 02/04/2022; therefore, will also increase his Chlorthalidone to 25mg  daily and may need to add Amlodipine as well depending on what his pressures have been running at home.  , PA-C 02/16/2022 8:21 AM  ADDENDUM:   Patient called back. Reviewed potassium results. He is feeling well - no palpitations, lightheadedness, dizziness, syncope.  Advised patient to stop Entresto. Will send in one time dose of Lokelma 10g to requested pharmacy. Will increased Chlorthalidone to 25mg  daily given patient is currently on Entresto 97-103 so suspect we will need more BP after stopping this. He does states his BP has been better at home with systolic BP in the 130s so will not add Amlodipine as mentioned above.  Patient will return to office on Monday for repeat BMET.  , PA-C 02/16/2022 9:04 AM

## 2022-02-16 NOTE — Addendum Note (Signed)
Addended by: Marjie Skiff on: 02/16/2022 09:07 AM   Modules accepted: Orders

## 2022-02-16 NOTE — Telephone Encounter (Signed)
Error

## 2022-02-18 ENCOUNTER — Telehealth: Payer: Self-pay | Admitting: Cardiology

## 2022-02-18 ENCOUNTER — Ambulatory Visit: Payer: No Typology Code available for payment source | Attending: Student

## 2022-02-18 DIAGNOSIS — E875 Hyperkalemia: Secondary | ICD-10-CM

## 2022-02-18 LAB — BASIC METABOLIC PANEL
BUN/Creatinine Ratio: 21 (ref 10–24)
BUN: 21 mg/dL (ref 8–27)
CO2: 31 mmol/L — ABNORMAL HIGH (ref 20–29)
Calcium: 10.3 mg/dL — ABNORMAL HIGH (ref 8.6–10.2)
Chloride: 101 mmol/L (ref 96–106)
Creatinine, Ser: 0.98 mg/dL (ref 0.76–1.27)
Glucose: 111 mg/dL — ABNORMAL HIGH (ref 70–99)
Potassium: 5.3 mmol/L — ABNORMAL HIGH (ref 3.5–5.2)
Sodium: 140 mmol/L (ref 134–144)
eGFR: 80 mL/min/{1.73_m2} (ref >59)

## 2022-02-18 NOTE — Telephone Encounter (Signed)
Pt c/o medication issue:  1. Name of Medication: chlorthalidone (HYGROTON) 25 MG tablet  2. How are you currently taking this medication (dosage and times per day)? [id number] [place], [place] - [address]  3. Are you having a reaction (difficulty breathing--STAT)? no  4. What is your medication issue? Pateint states the pharmacy dont have this medication is stock. Calling to see if its okay that he go a day or two without. Please advise

## 2022-02-18 NOTE — Telephone Encounter (Signed)
[facility]. Patient stated his BP 127/80. Patient stated he has no SOB and no edema. Informed patient that he should be fine. Patient stated he feels fine as well.

## 2022-02-19 ENCOUNTER — Other Ambulatory Visit: Payer: Self-pay

## 2022-02-19 DIAGNOSIS — E875 Hyperkalemia: Secondary | ICD-10-CM

## 2022-02-20 NOTE — Telephone Encounter (Signed)
Patient called back to say they only provided him with one dosage of the medication. Patient states he hasnt taken it yet. Calling to see what he needs to do. Please advise

## 2022-02-20 NOTE — Telephone Encounter (Signed)
Pt on way home will call back in 5 minutes ./cy

## 2022-02-20 NOTE — Telephone Encounter (Signed)
Spoke with Pharmacy and pt just picked up a 90 day supply of Chlorthalidone on 02/07/22 and should have plenty of meds Per pt did see that has enough for 45 days Since med was increased to whole tab from 1/2 tab Insurance should cover in about 30 days per Pharmacy since med was increased .Zack Seal

## 2022-02-26 ENCOUNTER — Ambulatory Visit: Payer: No Typology Code available for payment source | Attending: Interventional Cardiology

## 2022-02-26 DIAGNOSIS — E875 Hyperkalemia: Secondary | ICD-10-CM

## 2022-02-26 LAB — BASIC METABOLIC PANEL
BUN/Creatinine Ratio: 20 (ref 10–24)
BUN: 19 mg/dL (ref 8–27)
CO2: 31 mmol/L — ABNORMAL HIGH (ref 20–29)
Calcium: 9.5 mg/dL (ref 8.6–10.2)
Chloride: 103 mmol/L (ref 96–106)
Creatinine, Ser: 0.97 mg/dL (ref 0.76–1.27)
Glucose: 89 mg/dL (ref 70–99)
Potassium: 4 mmol/L (ref 3.5–5.2)
Sodium: 141 mmol/L (ref 134–144)
eGFR: 81 mL/min/{1.73_m2} (ref >59)

## 2022-03-10 NOTE — Progress Notes (Unsigned)
Cardiology Office Note    Date:  03/13/2022   ID:  Joshua Mejia, DOB 1945/12/04, MRN [id number]  PCP:  Christa See, FNP  Cardiologist:  Candee Furbish, MD  Electrophysiologist:  None   Chief Complaint: f/u HTN, hyperkalemia  History of Present Illness:   Joshua Mejia is a 76 y.o. male with history of paroxysmal atrial fib, chronic combined CHF, first degree AVB, coronary artery calcification and aortic atherosclerosis by prior CT (LAD per Dr. Marlou Porch' review), HTN, HLD, hyperkalemia who presents for cardiology f/u.   He was initially diagnosed with afib in 2021. He had gone for an eye exam and had findings that he was told may represent HTN or DM, so he followed up with his GP who incidentally found atrial fib. He was asymptomatic at the time. 2D echo 08/2019 showed mildly decreased EF 40-45%. He underwent DCCV 08/19/19. F/u echo 02/2020 EF 55%, g2DD, normal Rv, mod LAE. He was not felt to have required ischemic eval. He previously could not use spironolactone due to baseline hyperkalemia that occurred both spuriously in 2021 and 2022. When seen back for follow-up 01/2022, he was doing well except BP was elevated so chlorthalidone 12.5mg  daily was added. Potassium was normal at that visit. Interestingly following chlorthalidone initiation, his potassium actually began to rise for unclear reasons (with stable creatinine). Hyperkalemia was confirmed on recheck to 6.0, so his Delene Loll was discontinued. F/u BMETs showed improvement of K to 5.3 then back to 4.0 off Entresto. Chlorthalidone was increased to 25mg  daily.  He is seen back for follow-up today feeling well. He brings in a log of his BPs which primarily track in the mid 130s-low 140s. He denies any CP, SOB, palpitations, syncope or bleeding. He adamantly denies any specific dietary changes that would reflect high potassium intake either in form of foods or supplements. He does report he quit drinking.   Labwork independently  reviewed: 02/2022 HAD K AS HIGH AS 6.0, last value K 4.0, Cr 0.97 01/2022 CMET Wnl, LDL 49, trig 39, CBC wnl, TSH wnl  Cardiology Studies:   Studies reviewed are outlined and summarized above. Reports included below if pertinent.   2D Echo 02/2020  IMPRESSIONS     1. Left ventricular ejection fraction, by estimation, is 55%. The left  ventricle has normal function. The left ventricle has no regional wall  motion abnormalities. Left ventricular diastolic parameters are consistent  with Grade II diastolic dysfunction  (pseudonormalization).   2. Right ventricular systolic function is normal. The right ventricular  size is normal. There is normal pulmonary artery systolic pressure. The  estimated right ventricular systolic pressure is 94.8 mmHg.   3. Left atrial size was moderately dilated.   4. The mitral valve is normal in structure. Trivial mitral valve  regurgitation. No evidence of mitral stenosis.   5. The aortic valve is tricuspid. Aortic valve regurgitation is mild. No  aortic stenosis is present.   6. The inferior vena cava is normal in size with greater than 50%  respiratory variability, suggesting right atrial pressure of 3 mmHg.     Past Medical History:  Diagnosis Date   Aortic atherosclerosis (HCC)    Chronic combined systolic and diastolic CHF (congestive heart failure) (HCC)    Coronary artery calcification seen on CT scan    Essential hypertension    First degree AV block    Hyperkalemia    Hyperlipidemia LDL goal <70    PAF (paroxysmal atrial fibrillation) ([place])  Past Surgical History:  Procedure Laterality Date   CARDIOVERSION N/A 08/19/2019   Procedure: CARDIOVERSION;  Surgeon: Sande Rives, MD;  Location: [facility] ENDOSCOPY;  Service: Cardiovascular;  Laterality: N/A;   SHOULDER SURGERY      Current Medications: Current Meds  Medication Sig   apixaban (ELIQUIS) 5 MG TABS tablet TAKE 1 TABLET BY MOUTH TWICE A DAY   chlorthalidone (HYGROTON) 25  MG tablet Take 1 tablet (25 mg total) by mouth daily.   ezetimibe (ZETIA) 10 MG tablet TAKE 1 TABLET BY MOUTH EVERY DAY   Glucosamine 500 MG CAPS 2 tablets daily.   metoprolol succinate (TOPROL-XL) 50 MG 24 hr tablet TAKE 1 TABLET (50 MG TOTAL) BY MOUTH 2 (TWO) TIMES DAILY. TAKE WITH OR IMMEDIATELY FOLLOWING A MEAL.   rosuvastatin (CRESTOR) 20 MG tablet TAKE 1 TABLET BY MOUTH EVERY DAY      Allergies:   Patient has no known allergies.   Social History   Socioeconomic History   Marital status: Married    Spouse name: Not on file   Number of children: Not on file   Years of education: Not on file   Highest education level: Not on file  Occupational History   Not on file  Tobacco Use   Smoking status: Never   Smokeless tobacco: Never  Substance and Sexual Activity   Alcohol use: Not on file   Drug use: Not Currently   Sexual activity: Not on file  Other Topics Concern   Not on file  Social History Narrative   Not on file   Social Determinants of Health   Financial Resource Strain: Not on file  Food Insecurity: Not on file  Transportation Needs: Not on file  Physical Activity: Not on file  Stress: Not on file  Social Connections: Not on file     Family History:  The patient's family history includes Hypertension in his mother.  ROS:   Please see the history of present illness.  All other systems are reviewed and otherwise negative.    EKG(s)/Additional Labs   EKG:  EKG is not ordered today  Recent Labs: 02/04/2022: ALT 19; Hemoglobin 13.2; Platelets 159; TSH 2.540 02/26/2022: BUN 19; Creatinine, Ser 0.97; Potassium 4.0; Sodium 141  Recent Lipid Panel    Component Value Date/Time   CHOL 135 02/04/2022 1625   TRIG 39 02/04/2022 1625   HDL 77 02/04/2022 1625   CHOLHDL 1.8 02/04/2022 1625   CHOLHDL 3.5 08/11/2019 0920   VLDL 8 08/11/2019 0920   LDLCALC 48 02/04/2022 1625    PHYSICAL EXAM:    VS:  BP (!) 148/82   Pulse 65   Ht 5\' 11"  (1.803 m)   Wt 200  lb (90.7 kg)   BMI 27.89 kg/m   BMI: Body mass index is 27.89 kg/m.  GEN: Well nourished, well developed male in no acute distress HEENT: normocephalic, atraumatic Neck: no JVD, carotid bruits, or masses Cardiac: RRR; no murmurs, rubs, or gallops, no edema  Respiratory:  clear to auscultation bilaterally, normal work of breathing GI: soft, nontender, nondistended, + BS MS: no deformity or atrophy Skin: warm and dry, no rash Neuro:  Alert and Oriented x 3, Strength and sensation are intact, follows commands Psych: euthymic mood, full affect  Wt Readings from Last 3 Encounters:  03/13/22 200 lb (90.7 kg)  02/04/22 212 lb (96.2 kg)  07/31/21 213 lb (96.6 kg)     ASSESSMENT & PLAN:   1. Hyperkalemia - very unusual trend that  is not easily explained. Had spuriously high levels in 2021 and 2022. His recent hyperkalemia occurred after chlorthalidone was added (even though K before this medicine was normal). Chlorthalidone is notorious for hyPOkalemia, not hyperkalemia. His hyperkalemia resolved after stopping Entresto. I will recheck BMET today and reach out to pharmD about whether it would be reasonable to re-trial a lower dose of ARB with close follow-up. He had reported taking his medicines as instructed and denied any excess dietary intake of potassium (either in food or supplement form) to explain these fluctuations. Addendum: per d/w pharmD, would be hesitant to re-introduce ARB given degree of hyperkalemia seen before. Would likely plan to trial amlodipine. See result note.  2. Essential HTN - suboptimally controlled. Await BMET as above. Consideration could be given to changing metoprolol to carvedilol but since he has done well on this from an afib standpoint, may be best to leave that aspect of the regimen alone.  HYPERTENSION CONTROL Vitals:   03/13/22 1504 03/13/22 1536  BP: (!) 148/82 (!) 160/85    The patient's blood pressure is elevated above target today.  In order to  address the patient's elevated BP: (Plan lab follow-up to guide next steps.)      3. Chronic combined CHF - appears euvolemic. Question if cardiomyopathy was related to atrial arrhythmia as his LV dysfunction resolved after medical therapy and maintenance of normal rhythm. Control Toprol. Will review question of ARB with pharmD as above. He is not on spironolactone due to intermittent hyperkalemia.  4. PAF, first degree AVB - remains in NSR without any recent breakthrough AF or symptoms of bradycardia. Continue Eliquis, metoprolol. Congratulated him on quitting drinking.  5. Coronary calcification and aortic atherosclerosis with HLD - not on ASA due to concomitant Eliquis. Continue rosuvastatin and ezetimibe. LFTs/lipids looked great in 01/2022.     Disposition: F/u with me in 3 months.   Medication Adjustments/Labs and Tests Ordered: Current medicines are reviewed at length with the patient today.  Concerns regarding medicines are outlined above. Medication changes, Labs and Tests ordered today are summarized above and listed in the Patient Instructions accessible in Encounters.   Signed, Laurann Montana, PA-C  03/13/2022 3:25 PM    [facility] Phone: [phone]; Fax: [phone]

## 2022-03-13 ENCOUNTER — Encounter: Payer: Self-pay | Admitting: Physician Assistant

## 2022-03-13 ENCOUNTER — Ambulatory Visit: Payer: No Typology Code available for payment source | Attending: Physician Assistant | Admitting: Physician Assistant

## 2022-03-13 ENCOUNTER — Ambulatory Visit: Payer: No Typology Code available for payment source | Admitting: Physician Assistant

## 2022-03-13 VITALS — BP 160/85 | HR 65 | Ht 71.0 in | Wt 200.0 lb

## 2022-03-13 DIAGNOSIS — E875 Hyperkalemia: Secondary | ICD-10-CM | POA: Diagnosis not present

## 2022-03-13 DIAGNOSIS — I10 Essential (primary) hypertension: Secondary | ICD-10-CM | POA: Diagnosis not present

## 2022-03-13 DIAGNOSIS — I5042 Chronic combined systolic (congestive) and diastolic (congestive) heart failure: Secondary | ICD-10-CM

## 2022-03-13 DIAGNOSIS — E785 Hyperlipidemia, unspecified: Secondary | ICD-10-CM

## 2022-03-13 DIAGNOSIS — I480 Paroxysmal atrial fibrillation: Secondary | ICD-10-CM

## 2022-03-13 DIAGNOSIS — I700 Atherosclerosis of aorta: Secondary | ICD-10-CM

## 2022-03-13 DIAGNOSIS — I2510 Atherosclerotic heart disease of native coronary artery without angina pectoris: Secondary | ICD-10-CM

## 2022-03-13 DIAGNOSIS — I2584 Coronary atherosclerosis due to calcified coronary lesion: Secondary | ICD-10-CM

## 2022-03-13 DIAGNOSIS — I440 Atrioventricular block, first degree: Secondary | ICD-10-CM

## 2022-03-13 LAB — BASIC METABOLIC PANEL
BUN/Creatinine Ratio: 21 (ref 10–24)
BUN: 25 mg/dL (ref 8–27)
CO2: 33 mmol/L — ABNORMAL HIGH (ref 20–29)
Calcium: 10.2 mg/dL (ref 8.6–10.2)
Chloride: 102 mmol/L (ref 96–106)
Creatinine, Ser: 1.18 mg/dL (ref 0.76–1.27)
Glucose: 99 mg/dL (ref 70–99)
Potassium: 4.7 mmol/L (ref 3.5–5.2)
Sodium: 141 mmol/L (ref 134–144)
eGFR: 64 mL/min/{1.73_m2} (ref >59)

## 2022-03-13 NOTE — Patient Instructions (Signed)
Medication Instructions:  Your physician recommends that you continue on your current medications as directed. Please refer to the Current Medication list given to you today. *If you need a refill on your cardiac medications before your next appointment, please call your pharmacy*   Lab Work: TODAY-BMET If you have labs (blood work) drawn today and your tests are completely normal, you will receive your results only by: [place] (if you have MyChart) OR A paper copy in the mail If you have any lab test that is abnormal or we need to change your treatment, we will call you to review the results.   Testing/Procedures: NONE ORDERED   Follow-Up: At [facility], you and your health needs are our priority.  As part of our continuing mission to provide you with exceptional heart care, we have created designated Provider Care Teams.  These Care Teams include your primary Cardiologist (physician) and Advanced Practice Providers (APPs -  Physician Assistants and Nurse Practitioners) who all work together to provide you with the care you need, when you need it.  We recommend signing up for the patient portal called "MyChart".  Sign up information is provided on this After Visit Summary.  MyChart is used to connect with patients for Virtual Visits (Telemedicine).  Patients are able to view lab/test results, encounter notes, upcoming appointments, etc.  Non-urgent messages can be sent to your provider as well.   To learn more about what you can do with MyChart, go to [email or web address].    Your next appointment:   3 month(s)  The format for your next appointment:   In Person  Provider:   Melina Copa, PA-C       Other Instructions   Important Information About Sugar

## 2022-03-14 ENCOUNTER — Ambulatory Visit: Payer: No Typology Code available for payment source | Admitting: Physician Assistant

## 2022-03-18 ENCOUNTER — Telehealth: Payer: Self-pay | Admitting: Physician Assistant

## 2022-03-18 NOTE — Telephone Encounter (Signed)
  Pt is returning call to get lab result. Also, pt said, his BP been consistently around 130 to 140 but never go up above 150. He said he doesn't want to do anymore lab work or any tests

## 2022-03-18 NOTE — Telephone Encounter (Signed)
Spoke with the patient and offered an appointment multiple times with Dr Marlou Porch. After the patient  complained about his blood pressure being fine. Patient states the only reason he is checking his blood pressure daily is because Dayna asked him too. He states his blood pressure runs between 130 and 140s. He states when he brought his bp readings Dayna just glanced over them and did not seem concerned about his readings. He stated multiple times that Dr Marlou Porch was not concerned about blood pressure, so he did not understand why Dayna was making a big deal. He states he does not want to have any more labs drawn. He asked if I was going to send the new rxs to the pharmacy and I told him no since he did not want do any of Dayna's recommendations. He states that is fine but he would do whatever we want him to do. The patient went on for about ten minutes with why his AVS had a high blood pressure reading on it when CMA check it it was 148/82 when Provider checked it was 160/85. He states his blood pressure never runs in the 160s. I informed the patient that the bp reading from the [place] and provider were both in his chart and the last reading is the one that printed on his AVS. I asked the patient multiple times if he would like to cancel his appointment with Dayna and see Dr Marlou Porch. He finally agreed to moving his appointment and states he prefers not to ever see Dayna again. Follow up appointment with Dr Marlou Porch has been scheduled for January 2024. The patient was finally agreeable and voiced understanding.

## 2022-03-20 NOTE — Telephone Encounter (Signed)
Patient agreeable and voiced understanding.  

## 2022-03-20 NOTE — Telephone Encounter (Signed)
Jerline Pain, MD  You; Shellia Cleverly, RN Yesterday (11:25 AM)    Based upon your conversation, lets keep things the same as they are.  It sounds to me like he does not desire to make any significant changes at this time.   Candee Furbish, MD    You  Jerline Pain, MD; Shellia Cleverly, RN 2 days ago    Hey do you want the patient to make the following med changes?  - decrease chlorthalidone back down to 12.5mg  daily  - add amlodipine - rx 10mg  tablet - start by taking 1/2 tablet for the first 3 days. If BP remains >130 despite half tablet, increase to full tablet daily   Be mindful when I called the patient I spent 20 minutes the first time and 15 minutes the second time on the phone listening to him fuss about his blood pressure

## 2022-05-07 ENCOUNTER — Encounter (HOSPITAL_COMMUNITY): Payer: Self-pay | Admitting: Emergency Medicine

## 2022-05-07 ENCOUNTER — Emergency Department (HOSPITAL_COMMUNITY): Payer: No Typology Code available for payment source

## 2022-05-07 ENCOUNTER — Other Ambulatory Visit: Payer: Self-pay

## 2022-05-07 ENCOUNTER — Emergency Department (HOSPITAL_COMMUNITY)
Admission: EM | Admit: 2022-05-07 | Discharge: 2022-05-08 | Disposition: A | Discharge status: Home / Self Care | Payer: No Typology Code available for payment source | Attending: Emergency Medicine | Admitting: Emergency Medicine

## 2022-05-07 DIAGNOSIS — R110 Nausea: Secondary | ICD-10-CM | POA: Diagnosis not present

## 2022-05-07 DIAGNOSIS — R61 Generalized hyperhidrosis: Secondary | ICD-10-CM | POA: Diagnosis not present

## 2022-05-07 DIAGNOSIS — Z7901 Long term (current) use of anticoagulants: Secondary | ICD-10-CM | POA: Insufficient documentation

## 2022-05-07 DIAGNOSIS — R42 Dizziness and giddiness: Secondary | ICD-10-CM | POA: Diagnosis present

## 2022-05-07 DIAGNOSIS — I639 Cerebral infarction, unspecified: Secondary | ICD-10-CM | POA: Diagnosis not present

## 2022-05-07 LAB — APTT: aPTT: 31 seconds (ref 24–36)

## 2022-05-07 LAB — COMPREHENSIVE METABOLIC PANEL
ALT: 29 U/L (ref 0–44)
AST: 28 U/L (ref 15–41)
Albumin: 4 g/dL (ref 3.5–5.0)
Alkaline Phosphatase: 50 U/L (ref 38–126)
Anion gap: 6 (ref 5–15)
BUN: 17 mg/dL (ref 8–23)
CO2: 29 mmol/L (ref 22–32)
Calcium: 9.5 mg/dL (ref 8.9–10.3)
Chloride: 103 mmol/L (ref 98–111)
Creatinine, Ser: 0.93 mg/dL (ref 0.61–1.24)
GFR, Estimated: >60 mL/min (ref >60)
Glucose, Bld: 138 mg/dL — ABNORMAL HIGH (ref 70–99)
Potassium: 3.8 mmol/L (ref 3.5–5.1)
Sodium: 138 mmol/L (ref 135–145)
Total Bilirubin: 0.7 mg/dL (ref 0.3–1.2)
Total Protein: 7 g/dL (ref 6.5–8.1)

## 2022-05-07 LAB — I-STAT CHEM 8, ED
BUN: 19 mg/dL (ref 8–23)
Calcium, Ion: 1.19 mmol/L (ref 1.15–1.40)
Chloride: 102 mmol/L (ref 98–111)
Creatinine, Ser: 0.9 mg/dL (ref 0.61–1.24)
Glucose, Bld: 139 mg/dL — ABNORMAL HIGH (ref 70–99)
HCT: 39 % (ref 39.0–52.0)
Hemoglobin: 13.3 g/dL (ref 13.0–17.0)
Potassium: 3.7 mmol/L (ref 3.5–5.1)
Sodium: 139 mmol/L (ref 135–145)
TCO2: 26 mmol/L (ref 22–32)

## 2022-05-07 LAB — DIFFERENTIAL
Abs Immature Granulocytes: 0.05 10*3/uL (ref 0.00–0.07)
Basophils Absolute: 0 10*3/uL (ref 0.0–0.1)
Basophils Relative: 0 %
Eosinophils Absolute: 0.1 10*3/uL (ref 0.0–0.5)
Eosinophils Relative: 1 %
Immature Granulocytes: 1 %
Lymphocytes Relative: 8 %
Lymphs Abs: 0.7 10*3/uL (ref 0.7–4.0)
Monocytes Absolute: 0.8 10*3/uL (ref 0.1–1.0)
Monocytes Relative: 8 %
Neutro Abs: 7.9 10*3/uL — ABNORMAL HIGH (ref 1.7–7.7)
Neutrophils Relative %: 82 %

## 2022-05-07 LAB — CBC
HCT: 39.9 % (ref 39.0–52.0)
Hemoglobin: 13.5 g/dL (ref 13.0–17.0)
MCH: 31.8 pg (ref 26.0–34.0)
MCHC: 33.8 g/dL (ref 30.0–36.0)
MCV: 94.1 fL (ref 80.0–100.0)
Platelets: 178 10*3/uL (ref 150–400)
RBC: 4.24 MIL/uL (ref 4.22–5.81)
RDW: 12.8 % (ref 11.5–15.5)
WBC: 9.5 10*3/uL (ref 4.0–10.5)
nRBC: 0 % (ref 0.0–0.2)

## 2022-05-07 LAB — PROTIME-INR
INR: 1.2 (ref 0.8–1.2)
Prothrombin Time: 15.1 seconds (ref 11.4–15.2)

## 2022-05-07 LAB — TROPONIN I (HIGH SENSITIVITY): Troponin I (High Sensitivity): 10 ng/L (ref <18)

## 2022-05-07 LAB — ETHANOL: Alcohol, Ethyl (B): <10 mg/dL (ref <10)

## 2022-05-07 MED ORDER — MECLIZINE HCL 25 MG PO TABS
25.0000 mg | ORAL_TABLET | Freq: Once | ORAL | Status: AC
  Administered 2022-05-07: 25 mg via ORAL
  Filled 2022-05-07: qty 1

## 2022-05-07 MED ORDER — STROKE: EARLY STAGES OF RECOVERY BOOK: Freq: Once | Status: DC

## 2022-05-07 MED ORDER — IOHEXOL 350 MG/ML SOLN
75.0000 mL | Freq: Once | INTRAVENOUS | Status: AC | PRN
  Administered 2022-05-07: 75 mL via INTRAVENOUS

## 2022-05-07 NOTE — ED Provider Triage Note (Signed)
Emergency Medicine Provider Triage Evaluation Note  EMRY BARBATO , a 76 y.o. male  was evaluated in triage.  Pt complains of vertigo and ataxia.  Patient states that around 10 AM he had sudden onset of room spinning dizziness noticed, diaphoresis and sweating, nausea and vomiting.  Patient states that his nausea is improved.  He still feels dizzy even when sitting still.  He denies any other complaint such as neck pain or headache.  Review of Systems  Positive: Vertigo  Negative: Headache  Physical Exam  BP (!) 148/69 (BP Location: Right Arm)   Pulse 63   Temp 97.6 F (36.4 C) (Axillary)   Resp 16   SpO2 100%  Gen:   Awake, no distress   Resp:  Normal effort  MSK:   Moves extremities without difficulty  Other:  Right lateral nystagmus, ataxia and balance issues with standing unable to ambulate without assistance  Medical Decision Making  Medically screening exam initiated at 1:07 PM.  Appropriate orders placed.  Deanna Artis Stair was informed that the remainder of the evaluation will be completed by another provider, this initial triage assessment does not replace that evaluation, and the importance of remaining in the ED until their evaluation is complete.  Workup initiated   Arthor Captain, PA-C 05/07/22 1315

## 2022-05-07 NOTE — ED Notes (Signed)
Called multiple times for vitals and no response

## 2022-05-07 NOTE — ED Triage Notes (Signed)
Pt arrives via EMS from work- sudden onset dizzines, nausea, cold sweats that started at 1030. BP 196/110.- improved 168/74 with no meds. Denies CP, blurry vision, weakness.

## 2022-05-07 NOTE — Consult Note (Addendum)
Neurology Consultation Reason for Consult: Acute onset dizziness Requesting Physician: Arthor Captain  CC: Dizziness  History is obtained from: Patient and chart review  HPI: Joshua Mejia is a 76 y.o. male with past medical history significant for atrial fibrillation on Eliquis (last dose this morning), hypertension, hyperlipidemia, combined systolic and diastolic congestive heart failure, aortic atherosclerosis, first-degree AV block, hyperkalemia on Lokelma.  He was in his usual state of health today driving for work when he suddenly began to feel dizzy, nauseated with cold sweats.  Initially blood pressure was 196/110 but improved to 168/74 without treatment.  Denying any headache.  Is having difficulty with ambulation but was able to stop safely, no falls, no trauma   LKW: 10 AM Thrombolytic given?: No, last dose of Eliquis this morning IA performed?: No, exam not consistent with LVO  Premorbid modified rankin scale:      0 - No symptoms.  ROS: All other review of systems was negative except as noted in the HPI.   Past Medical History:  Diagnosis Date   Aortic atherosclerosis (HCC)    Chronic combined systolic and diastolic CHF (congestive heart failure) (HCC)    Coronary artery calcification seen on CT scan    Essential hypertension    First degree AV block    Hyperkalemia    Hyperlipidemia LDL goal <70    PAF (paroxysmal atrial fibrillation) (HCC)    Past Surgical History:  Procedure Laterality Date   CARDIOVERSION N/A 08/19/2019   Procedure: CARDIOVERSION;  Surgeon: Sande Rives, MD;  Location: [facility] ENDOSCOPY;  Service: Cardiovascular;  Laterality: N/A;   SHOULDER SURGERY      Current Outpatient Medications  Medication Instructions   apixaban (ELIQUIS) 5 MG TABS tablet TAKE 1 TABLET BY MOUTH TWICE A DAY   chlorthalidone (HYGROTON) 25 mg, Oral, Daily   ezetimibe (ZETIA) 10 MG tablet TAKE 1 TABLET BY MOUTH EVERY DAY   Glucosamine 500 MG CAPS 2 tablets,  Daily   metoprolol succinate (TOPROL-XL) 50 mg, Oral, 2 times daily, Take with or immediately following a meal.   rosuvastatin (CRESTOR) 20 mg, Oral, Daily    Family History  Problem Relation Age of Onset   Hypertension Mother     Social History:  reports that he has never smoked. He has never used smokeless tobacco. He reports that he does not currently use drugs. No history on file for alcohol use.   Exam: Current vital signs: BP (!) 148/69 (BP Location: Right Arm)   Pulse 63   Temp 97.6 F (36.4 C) (Axillary)   Resp 16   SpO2 100%  Vital signs in last 24 hours: Temp:  [97.6 F (36.4 C)] 97.6 F (36.4 C) (11/28 1300) Pulse Rate:  [63] 63 (11/28 1300) Resp:  [16] 16 (11/28 1300) BP: (148)/(69) 148/69 (11/28 1300) SpO2:  [100 %] 100 % (11/28 1300)   Physical Exam  Constitutional: Appears well-developed and well-nourished.  Psych: Affect at times oddly bemused but calm and cooperative Eyes: No scleral injection HENT: No oropharyngeal obstruction.  MSK: no joint deformities.  Cardiovascular: Perfusing extremities well Respiratory: Effort normal, non-labored breathing GI: Soft.  No distension. There is no tenderness.  Skin: Warm dry and intact visible skin, chronic left forearm partially healed laceration he reports has been stable for 2 years  Neuro: Mental Status: Patient is awake, alert, oriented to person, place, month, year, and situation. Patient is able to give a clear and coherent history. No signs of aphasia or neglect Cranial Nerves:  II: Visual Fields are full. Pupils are equal, round, and reactive to light.   III,IV, VI: EOMI without ptosis or diploplia.  Nystagmus, right beating and torsional on right gaze towards the right ear, torsional and right beating in upgaze, minimal right beating on left gaze, not clearly appreciated in downgaze V: Facial sensation is symmetric to light touch VII: Facial movement is notable for mild right facial droop, possibly  chronic based on driver's license though not clearly smiling in that picture.  VIII: hearing is hard of hearing at baseline, reports that symmetric sound in both ears with tuning fork applied to the frontal bone X: Uvula elevates symmetrically XI: Shoulder shrug is symmetric. XII: tongue is midline without atrophy or fasciculations.  Motor: Tone is normal. Bulk is normal. 5/5 strength was present in all four extremities other than mild bilateral hip flexion weakness 4/5.  Sensory: Sensation is symmetric to light touch in the arms and legs. Deep Tendon Reflexes: 3+ and symmetric in the brachioradialis and patellae.  Cerebellar: Possible very mild dysmetria in the left upper extremity, otherwise intact finger-nose and heel-to-shin Gait:  Wide-based ataxic gait, markedly different from baseline per patient  NIHSS total 3 Score breakdown: 1.4 right facial droop likely chronic, 1-point for left lower extremity drift, 1-point for right lower extremity drift Performed at time of patient arrival to ED    I have reviewed labs in epic and the results pertinent to this consultation are:  Basic Metabolic Panel: Recent Labs  Lab 05/07/22 1313 05/07/22 1339  NA 138 139  K 3.8 3.7  CL 103 102  CO2 29  --   GLUCOSE 138* 139*  BUN 17 19  CREATININE 0.93 0.90  CALCIUM 9.5  --     CBC: Recent Labs  Lab 05/07/22 1313 05/07/22 1339  WBC 9.5  --   NEUTROABS 7.9*  --   HGB 13.5 13.3  HCT 39.9 39.0  MCV 94.1  --   PLT 178  --     Coagulation Studies: Recent Labs    05/07/22 1313  LABPROT 15.1  INR 1.2      I have reviewed the images obtained:  CTA head and neck personally reviewed, agree with radiology:   1. No acute intracranial abnormality. Mild atrophy and mild chronic microvascular ischemic change in the white matter. 2. No significant carotid or vertebral artery stenosis in the neck. 3. No intracranial large vessel occlusion. 4. Aortic atherosclerosis.  MRI brain   No acute intracranial pathology.  Impression: 76 year old male presenting with acute onset dizziness while driving concerning for small brainstem or cerebellar stroke.  Exam is notable for nystagmus not fully obeying Alexanders law and gait impairment.  Given negative MRI suspect most likely small vessel disease with stroke too small to be seen on MRI although acute labyrinthitis remains in the differential  # MRI negative brainstem  stroke versus acute labyrinthitis - Stroke labs HgbA1c, fasting lipid panel, UA, UDS - Goal A1c less than 7%, adjust medications as needed - Goal LDL less than 70, already on Crestor 20 and Zetia so may need PSK 9 inhibitor if it remains elevated despite these medication (if he confirms adherence) - MRI brain ordered on my initial recommendation and completed as above - CTA already completed as above - Frequent neuro checks - Echocardiogram not indicated from a neurological perspective given he already has an indication for chronic anticoagulation due to known atrial fibrillation - Given negative MRI okay to continue Eliquis, hold off on antiplatelets  given he is on therapeutic anticoagulation - Risk factor modification - Telemetry monitoring - Blood pressure goal -- goal normotension to be achieved gradually - PT consult, OT consult, Speech consult, unless patient returns back to baseline - Stroke team for follow for additional patient counseling  Brooke Dare MD-PhD [facility] [phone] Available 7 AM to 7 PM, outside these hours please contact Neurologist on call listed on AMION

## 2022-05-08 LAB — TSH: TSH: 1.684 u[IU]/mL (ref 0.350–4.500)

## 2022-05-08 LAB — TROPONIN I (HIGH SENSITIVITY): Troponin I (High Sensitivity): 16 ng/L (ref <18)

## 2022-05-08 MED ORDER — MECLIZINE HCL 25 MG PO TABS: 25.0000 mg | ORAL_TABLET | Freq: Three times a day (TID) | ORAL | 0 refills | Status: DC | PRN

## 2022-05-08 NOTE — ED Notes (Signed)
Patient verbalizes understanding of discharge instructions. Opportunity for questioning and answers were provided. Armband removed by staff, pt discharged from ED. Ambulated out to lobby, ride awaiting outside

## 2022-05-08 NOTE — Discharge Instructions (Addendum)
Your blood pressure is slightly elevated above your baseline.  This could be related to being in the ER, begin worried about your condition or multiple other factors.  I would continue checking it at home and if it remains above 140 on the top number follow-up with your primary provider in 7 to 10 days for further management.

## 2022-05-08 NOTE — ED Provider Notes (Signed)
[facility] EMERGENCY DEPARTMENT Provider Note   CSN: [id number] Arrival date & time: 05/07/22  1232     History  No chief complaint on file.   Joshua Mejia is a 76 y.o. male.  76 year old male presents the ER today with dizziness.  Patient states that this morning at acute onset of feeling like his head was moved all over the place and off balance we could not walk.  He was significantly nauseous and diaphoretic for about an hour during that time as well.  States that he did not throw up.  Stayed dizzy even after the nausea and diaphoresis went away so he came to ER to be evaluated.  Here he was given meclizine he states that that has totally resolved the symptoms and has been symptom-free for the last Ultibro hours.  Ambulated back to the bed without difficulty.  He states that during the episode he did not notice any change in his speech, strength, sensation or vision.  And at this time those remain at baseline as well.  No recent illnesses.  No ear pain or headaches.  He states his blood pressure is low but higher than normal.        Home Medications Prior to Admission medications   Medication Sig Start Date End Date Taking? Authorizing Provider  meclizine (ANTIVERT) 25 MG tablet Take 1 tablet (25 mg total) by mouth 3 (three) times daily as needed for dizziness. 05/08/22  Yes Jaceion Aday, Barbara Cower, MD  apixaban (ELIQUIS) 5 MG TABS tablet TAKE 1 TABLET BY MOUTH TWICE A DAY 10/22/21   Jake Bathe, MD  chlorthalidone (HYGROTON) 25 MG tablet Take 1 tablet (25 mg total) by mouth daily. 02/16/22   Marjie Skiff E, PA-C  ezetimibe (ZETIA) 10 MG tablet TAKE 1 TABLET BY MOUTH EVERY DAY 01/28/22   Jake Bathe, MD  Glucosamine 500 MG CAPS 2 tablets daily.    [provider]  metoprolol succinate (TOPROL-XL) 50 MG 24 hr tablet TAKE 1 TABLET (50 MG TOTAL) BY MOUTH 2 (TWO) TIMES DAILY. TAKE WITH OR IMMEDIATELY FOLLOWING A MEAL. 10/08/21   Jake Bathe, MD   rosuvastatin (CRESTOR) 20 MG tablet TAKE 1 TABLET BY MOUTH EVERY DAY 01/28/22   Jake Bathe, MD      Allergies    Patient has no known allergies.    Review of Systems   Review of Systems  Physical Exam Updated Vital Signs BP (!) 151/90 (BP Location: Right Arm)   Pulse 79   Temp 97.9 F (36.6 C)   Resp 18   SpO2 98%  Physical Exam Vitals and nursing note reviewed.  Constitutional:      Appearance: He is well-developed.  HENT:     Head: Normocephalic and atraumatic.  Cardiovascular:     Rate and Rhythm: Normal rate.  Pulmonary:     Effort: Pulmonary effort is normal. No respiratory distress.  Abdominal:     General: There is no distension.  Musculoskeletal:        General: Normal range of motion.     Cervical back: Normal range of motion.  Neurological:     Mental Status: He is alert.     Comments: No altered mental status, able to give full seemingly accurate history.  Face is symmetric, EOM's intact, pupils equal and reactive, vision intact, tongue and uvula midline without deviation. Upper and Lower extremity motor 5/5, intact pain perception in distal extremities, 2+ reflexes in biceps, patella and achilles  tendons. Able to perform finger to nose normal with both hands. Walks without assistance or evident ataxia.      ED Results / Procedures / Treatments   Labs (all labs ordered are listed, but only abnormal results are displayed) Labs Reviewed  DIFFERENTIAL - Abnormal; Notable for the following components:      Result Value   Neutro Abs 7.9 (*)    All other components within normal limits  COMPREHENSIVE METABOLIC PANEL - Abnormal; Notable for the following components:   Glucose, Bld 138 (*)    All other components within normal limits  I-STAT CHEM 8, ED - Abnormal; Notable for the following components:   Glucose, Bld 139 (*)    All other components within normal limits  ETHANOL  PROTIME-INR  APTT  CBC  RAPID URINE DRUG SCREEN, HOSP PERFORMED   URINALYSIS, ROUTINE W REFLEX MICROSCOPIC  HEMOGLOBIN A1C  TSH  LIPID PANEL  TROPONIN I (HIGH SENSITIVITY)  TROPONIN I (HIGH SENSITIVITY)    EKG EKG Interpretation  Date/Time:  Tuesday May 07 2022 13:07:31 EST Ventricular Rate:  63 PR Interval:  230 QRS Duration: 112 QT Interval:  428 QTC Calculation: 437 R Axis:   26 Text Interpretation: Sinus rhythm with 1st degree A-V block with occasional Premature ventricular complexes Incomplete left bundle branch block Minimal voltage criteria for LVH, may be normal variant ( Cornell product ) Borderline ECG When compared with ECG of 26-Aug-2019 08:41, PREVIOUS ECG IS PRESENT Confirmed by Marily Memos [phone]) on 05/08/2022 12:31:08 AM  Radiology MR BRAIN WO CONTRAST  Result Date: 05/07/2022 CLINICAL DATA:  Vertigo and ataxia EXAM: MRI HEAD WITHOUT CONTRAST TECHNIQUE: Multiplanar, multiecho pulse sequences of the brain and surrounding structures were obtained without intravenous contrast. COMPARISON:  Same-day CT/CTA head and neck FINDINGS: Brain: There is no acute intracranial hemorrhage, extra-axial fluid collection, or acute infarct. There is background parenchymal volume loss with prominence of the ventricular system and extra-axial CSF spaces. Patchy FLAIR signal abnormality in the supratentorial white matter likely reflects sequela of mild chronic small-vessel ischemic change. There is no suspicious parenchymal signal abnormality or mass lesion. There is no mass effect or midline shift. Vascular: Normal flow voids. Skull and upper cervical spine: Normal marrow signal. Sinuses/Orbits: The paranasal sinuses are clear. The globes and orbits are unremarkable. Other: None. IMPRESSION: No acute intracranial pathology. Electronically Signed   By: Lesia Hausen M.D.   On: 05/07/2022 15:52   CT ANGIO HEAD NECK W WO CM  Result Date: 05/07/2022 CLINICAL DATA:  Central vertigo EXAM: CT ANGIOGRAPHY HEAD AND NECK TECHNIQUE: Multidetector CT imaging  of the head and neck was performed using the standard protocol during bolus administration of intravenous contrast. Multiplanar CT image reconstructions and MIPs were obtained to evaluate the vascular anatomy. Carotid stenosis measurements (when applicable) are obtained utilizing NASCET criteria, using the distal internal carotid diameter as the denominator. RADIATION DOSE REDUCTION: This exam was performed according to the departmental dose-optimization program which includes automated exposure control, adjustment of the mA and/or kV according to patient size and/or use of iterative reconstruction technique. CONTRAST:  31mL OMNIPAQUE IOHEXOL 350 MG/ML SOLN COMPARISON:  None Available. FINDINGS: CT HEAD FINDINGS Brain: Mild atrophy. Mild patchy white matter hypodensity. Negative for acute infarct, hemorrhage, mass. Negative for hydrocephalus. Vascular: Negative for hyperdense vessel Skull: Negative Sinuses/Orbits: Paranasal sinuses clear.  Negative orbit Other: None Review of the MIP images confirms the above findings CTA NECK FINDINGS Aortic arch: Mild atherosclerotic calcification aortic arch. Proximal great vessels widely patent  Right carotid system: Mild atherosclerotic calcification right carotid bifurcation. Negative for right carotid stenosis Left carotid system: Mild atherosclerotic calcification left carotid bifurcation. Negative for left carotid stenosis. Vertebral arteries: Both vertebral arteries patent to the skull base without significant stenosis. Skeleton: Mild cervical spondylosis.  No acute skeletal abnormality. Other neck: Negative for mass or adenopathy Upper chest: Lung apices clear bilaterally Review of the MIP images confirms the above findings CTA HEAD FINDINGS Anterior circulation: Internal carotid artery widely patent through the skull base and cavernous segment. Mild atherosclerotic calcification in the cavernous carotid bilaterally. Anterior and middle cerebral arteries patent without  stenosis or large vessel occlusion Posterior circulation: Both vertebral arteries patent to the basilar. PICA patent bilaterally. Basilar patent. Superior cerebellar and posterior cerebral arteries patent bilaterally without stenosis. Fetal origin right posterior cerebral artery. Venous sinuses: Normal venous enhancement Anatomic variants: None Review of the MIP images confirms the above findings IMPRESSION: 1. No acute intracranial abnormality. Mild atrophy and mild chronic microvascular ischemic change in the white matter. 2. No significant carotid or vertebral artery stenosis in the neck. 3. No intracranial large vessel occlusion. 4. Aortic atherosclerosis. Aortic Atherosclerosis (ICD10-I70.0). Electronically Signed   By: Marlan Palau M.D.   On: 05/07/2022 14:44    Procedures Procedures    Medications Ordered in ED Medications   stroke: early stages of recovery book (has no administration in time range)  meclizine (ANTIVERT) tablet 25 mg (25 mg Oral Given 05/07/22 1321)  iohexol (OMNIPAQUE) 350 MG/ML injection 75 mL (75 mLs Intravenous Contrast Given 05/07/22 1428)    ED Course/ Medical Decision Making/ A&P                           Medical Decision Making  CT angio of his head and neck along with his MRI were both negative as interpreted by myself.  Radiology read reviewed as well.  With these negative studies, normal neuroexam and the fact that it got better with meclizine gives me a low suspicion for stroke, vertebral artery pathology or other emergent causes.  Meclizine will be prescribed for peripheral vertigo. His EKG, troponins were negative making ACS unlikely. Will follow-up with PCP for blood pressure checks.  Final Clinical Impression(s) / ED Diagnoses Final diagnoses:  Vertigo    Rx / DC Orders ED Discharge Orders          Ordered    meclizine (ANTIVERT) 25 MG tablet  3 times daily PRN        05/08/22 0041              Orvetta Danielski, Barbara Cower, MD 05/08/22 9935

## 2022-05-09 LAB — HEMOGLOBIN A1C
Hgb A1c MFr Bld: 5.4 % (ref 4.8–5.6)
Mean Plasma Glucose: 108 mg/dL

## 2022-06-17 ENCOUNTER — Ambulatory Visit: Payer: No Typology Code available for payment source | Admitting: Physician Assistant

## 2022-06-19 ENCOUNTER — Ambulatory Visit: Payer: Commercial Managed Care - PPO | Attending: Physician Assistant | Admitting: Cardiology

## 2022-06-19 ENCOUNTER — Encounter: Payer: Self-pay | Admitting: Cardiology

## 2022-06-19 VITALS — BP 134/60 | HR 62 | Ht 71.0 in | Wt 207.0 lb

## 2022-06-19 DIAGNOSIS — I10 Essential (primary) hypertension: Secondary | ICD-10-CM

## 2022-06-19 DIAGNOSIS — I5042 Chronic combined systolic (congestive) and diastolic (congestive) heart failure: Secondary | ICD-10-CM | POA: Diagnosis not present

## 2022-06-19 MED ORDER — CHLORTHALIDONE 25 MG PO TABS: 25.0000 mg | ORAL_TABLET | Freq: Every day | ORAL | 3 refills | Status: DC

## 2022-06-19 NOTE — Progress Notes (Signed)
Cardiology Office Note:    Date:  06/19/2022   ID:  Joshua Mejia, DOB 09/24/1945, MRN [id number]  PCP:  [facility] Providers Cardiologist:  Donato Schultz, MD     Referring MD: Ayesha Rumpf, FNP    History of Present Illness:    Joshua Mejia is a 77 y.o. male here for follow up of:  Specialty Problems       Cardiology Problems   Aortic atherosclerosis (HCC)   Coronary artery disease involving native coronary artery of native heart without angina pectoris    LAD calcification seen on CT, 07/2019      Essential hypertension   Persistent atrial fibrillation (HCC)    DCCV 08/2019 Asymptomatic       Pure hypercholesterolemia    Crestor 20 and Zetia 10      Chronic combined systolic and diastolic heart failure (HCC)    EF 45 prior to DCCV EF 55% post No spironolactone due to hyperkalemia        Chronic systolic HF - EF 33% most recent post DCCV 55%. Not on spironolactone due to high potassium.  PAF - dx 2021 after eye exam. DCCV 08/19/19, asymptomatic 1st degree AVB CAD (LAD calcification) Aortic atherosclerosis  Today, he is doing well with no major complaints. He is compliant with his medications. He is tolerating the Eliquis. His blood pressure at home after 3 hours of taking his medications is typically 130s systolic over 60 diastolic. Recently, he has not been recording his blood pressure. He stays active by walking.   He denies any chest pain, or shortness of breath, lightheadedness, headaches, syncope, orthopnea, PND, lower extremity edema or exertional symptoms.  Past Medical History:  Diagnosis Date   Aortic atherosclerosis (HCC)    Chronic combined systolic and diastolic CHF (congestive heart failure) (HCC)    Coronary artery calcification seen on CT scan    Essential hypertension    First degree AV block    Hyperkalemia    Hyperlipidemia LDL goal <70    PAF (paroxysmal atrial fibrillation) (HCC)      Past Surgical History:  Procedure Laterality Date   CARDIOVERSION N/A 08/19/2019   Procedure: CARDIOVERSION;  Surgeon: Sande Rives, MD;  Location: [facility] ENDOSCOPY;  Service: Cardiovascular;  Laterality: N/A;   SHOULDER SURGERY      Current Medications: Current Meds  Medication Sig   apixaban (ELIQUIS) 5 MG TABS tablet TAKE 1 TABLET BY MOUTH TWICE A DAY   ezetimibe (ZETIA) 10 MG tablet TAKE 1 TABLET BY MOUTH EVERY DAY   Glucosamine 500 MG CAPS 2 tablets daily.   metoprolol succinate (TOPROL-XL) 50 MG 24 hr tablet TAKE 1 TABLET (50 MG TOTAL) BY MOUTH 2 (TWO) TIMES DAILY. TAKE WITH OR IMMEDIATELY FOLLOWING A MEAL.   rosuvastatin (CRESTOR) 20 MG tablet TAKE 1 TABLET BY MOUTH EVERY DAY   [DISCONTINUED] chlorthalidone (HYGROTON) 25 MG tablet Take 1 tablet (25 mg total) by mouth daily.     Allergies:   Patient has no known allergies.   Social History   Socioeconomic History   Marital status: Married    Spouse name: Not on file   Number of children: Not on file   Years of education: Not on file   Highest education level: Not on file  Occupational History   Not on file  Tobacco Use   Smoking status: Never   Smokeless tobacco: Never  Substance and Sexual Activity   Alcohol use:  Not on file   Drug use: Not Currently   Sexual activity: Not on file  Other Topics Concern   Not on file  Social History Narrative   Not on file   Social Determinants of Health   Financial Resource Strain: Not on file  Food Insecurity: Not on file  Transportation Needs: Not on file  Physical Activity: Not on file  Stress: Not on file  Social Connections: Not on file     Family History: The patient's family history includes Hypertension in his mother.  ROS:   Please see the history of present illness.    All other systems reviewed and are negative.  EKGs/Labs/Other Studies Reviewed:    The following studies were reviewed today: Echo 02/15/20 1. Left ventricular ejection fraction,  by estimation, is 55%. The left  ventricle has normal function. The left ventricle has no regional wall  motion abnormalities. Left ventricular diastolic parameters are consistent with Grade II diastolic dysfunction (pseudonormalization).   2. Right ventricular systolic function is normal. The right ventricular  size is normal. There is normal pulmonary artery systolic pressure. The estimated right ventricular systolic pressure is 75.9 mmHg.   3. Left atrial size was moderately dilated.   4. The mitral valve is normal in structure. Trivial mitral valve  regurgitation. No evidence of mitral stenosis.   5. The aortic valve is tricuspid. Aortic valve regurgitation is mild. No  aortic stenosis is present.   6. The inferior vena cava is normal in size with greater than 50%  respiratory variability, suggesting right atrial pressure of 3 mmHg.   Echo 09/02/19  1. Left ventricular ejection fraction, by estimation, is 40 to 45%. The  left ventricle has mildly decreased function. The left ventricle  demonstrates global hypokinesis. There is mild left ventricular  hypertrophy. Left ventricular diastolic parameters  are consistent with Grade III diastolic dysfunction (restrictive).  Elevated left atrial pressure.   2. Right ventricular systolic function is normal. The right ventricular  size is mildly enlarged. There is mildly elevated pulmonary artery  systolic pressure. The estimated right ventricular systolic pressure is  16.3 mmHg.   3. Left atrial size was moderately dilated.   4. The mitral valve is normal in structure. Mild mitral valve  regurgitation.   5. The aortic valve is tricuspid. Aortic valve regurgitation is mild. No  aortic stenosis is present.   6. The inferior vena cava is normal in size with greater than 50%  respiratory variability, suggesting right atrial pressure of 3 mmHg.   CTA Chest 07/20/19 IMPRESSION: 1. No evidence of pulmonary embolus. 2. Trace right pleural  effusion. 3. Borderline enlarged mediastinal lymph nodes, nonspecific. 4. Cardiomegaly. 5. Aortic Atherosclerosis (ICD10-I70.0).  Lower Venous DVT Study 07/20/19 RIGHT:  - No evidence of common femoral vein obstruction.     LEFT:  - There is no evidence of deep vein thrombosis in the lower extremity.     - No cystic structure found in the popliteal fossa.     *See table(s) above for measurements and observations.   EKG:  EKG was personally reviewed 07/31/21: Sinus rhythm first-degree AV block 65 bpm with PAC  02/06/21: NSR 69 bpm mild first degree AV-block  Recent Labs: 05/07/2022: ALT 29; BUN 19; Creatinine, Ser 0.90; Hemoglobin 13.3; Platelets 178; Potassium 3.7; Sodium 139 05/08/2022: TSH 1.684  Recent Lipid Panel    Component Value Date/Time   CHOL 135 02/04/2022 1625   TRIG 39 02/04/2022 1625   HDL 77 02/04/2022 1625  CHOLHDL 1.8 02/04/2022 1625   CHOLHDL 3.5 08/11/2019 0920   VLDL 8 08/11/2019 0920   LDLCALC 48 02/04/2022 1625     Risk Assessment/Calculations:              Physical Exam:    VS:  BP 134/60   Pulse 62   Ht 5\' 11"  (1.803 m)   Wt 207 lb (93.9 kg)   SpO2 99%   BMI 28.87 kg/m     Wt Readings from Last 3 Encounters:  06/19/22 207 lb (93.9 kg)  03/13/22 200 lb (90.7 kg)  02/04/22 212 lb (96.2 kg)     GEN: Well nourished, well developed in no acute distress HEENT: Normal NECK: No JVD; No carotid bruits LYMPHATICS: No lymphadenopathy CARDIAC: Regular rate and rhythm, soft systolic murmur, no rubs, gallops RESPIRATORY:  Clear to auscultation without rales, wheezing or rhonchi  ABDOMEN: Soft, non-tender, non-distended MUSCULOSKELETAL:  No edema; No deformity  SKIN: Warm and dry NEUROLOGIC:  Alert and oriented x 3 PSYCHIATRIC:  Normal affect   ASSESSMENT:    1. Chronic combined systolic and diastolic CHF (congestive heart failure) ([place])   2. Essential hypertension     PLAN:     Chronic combined systolic and diastolic heart failure  (HCC) Overall well compensated NYHA class I.  EF most recently 55%.   Also on metoprolol 50 mg twice a day.  This also helps with rate control of his atrial fibrillation.  He is not on spironolactone because of prior hyperkalemia.  Hyperkalemia - Occasional values.  Hesitant to reintroduce ARB/Entresto because of prior hyperkalemia.   Essential hypertension Multiple readings at home doing well.   He has in the past checked his blood pressures at home repetitively and they had been in the 161 systolic range.  Continue with current medical management.  Trialed amlodipine.  Continue with chlorthalidone.  Toprol.   Pure hypercholesterolemia LDL 58 with Zetia and rosuvastatin.  Excellent.  Has coronary artery calcification.  Stabilizing plaque.   Coronary artery disease involving native coronary artery of native heart without angina pectoris Prior LAD calcification seen on CT scan in 2021.  Continue with aggressive risk factor modification, LDL goal less than 70.  Excellent.  See above.   Secondary hypercoagulable state ([place]) Eliquis twice a day.  No bleeding.  Prior creatinine 0.7 hemoglobin 13.3, stable.  Excellent.  TSH 2.1.   Aortic atherosclerosis (HCC) Seen on CT scan.  Continue with statin.  Not on aspirin because of Eliquis   Persistent atrial fibrillation (HCC) Today normal sinus rhythm.  First-degree AV block PAC noted.  On exam sounded normal asymptomatic.      Medication Adjustments/Labs and Tests Ordered: Current medicines are reviewed at length with the patient today.  Concerns regarding medicines are outlined above.  No orders of the defined types were placed in this encounter.  Meds ordered this encounter  Medications   chlorthalidone (HYGROTON) 25 MG tablet    Sig: Take 1 tablet (25 mg total) by mouth daily.    Dispense:  90 tablet    Refill:  3    Patient Instructions  Medication Instructions:  Your physician recommends that you continue on your current medications  as directed. Please refer to the Current Medication list given to you today.  *If you need a refill on your cardiac medications before your next appointment, please call your pharmacy*  Lab Work: If you have labs (blood work) drawn today and your tests are completely normal, you will receive your  results only by: MyChart Message (if you have MyChart) OR A paper copy in the mail If you have any lab test that is abnormal or we need to change your treatment, we will call you to review the results.  Follow-Up: At [facility], you and your health needs are our priority.  As part of our continuing mission to provide you with exceptional heart care, we have created designated Provider Care Teams.  These Care Teams include your primary Cardiologist (physician) and Advanced Practice Providers (APPs -  Physician Assistants and Nurse Practitioners) who all work together to provide you with the care you need, when you need it.  We recommend signing up for the patient portal called "MyChart".  Sign up information is provided on this After Visit Summary.  MyChart is used to connect with patients for Virtual Visits (Telemedicine).  Patients are able to view lab/test results, encounter notes, upcoming appointments, etc.  Non-urgent messages can be sent to your provider as well.   To learn more about what you can do with MyChart, go to [email or web address].    Your next appointment:   6 month(s)  The format for your next appointment:   In Person  Provider:   Donato Schultz, MD      Important Information About Sugar          Signed, Donato Schultz, MD  06/19/2022 1:33 PM    [facility]

## 2022-06-19 NOTE — Patient Instructions (Signed)
Medication Instructions:  Your physician recommends that you continue on your current medications as directed. Please refer to the Current Medication list given to you today.  *If you need a refill on your cardiac medications before your next appointment, please call your pharmacy*  Lab Work: If you have labs (blood work) drawn today and your tests are completely normal, you will receive your results only by: [place] (if you have MyChart) OR A paper copy in the mail If you have any lab test that is abnormal or we need to change your treatment, we will call you to review the results.  Follow-Up: At [facility], you and your health needs are our priority.  As part of our continuing mission to provide you with exceptional heart care, we have created designated Provider Care Teams.  These Care Teams include your primary Cardiologist (physician) and Advanced Practice Providers (APPs -  Physician Assistants and Nurse Practitioners) who all work together to provide you with the care you need, when you need it.  We recommend signing up for the patient portal called "MyChart".  Sign up information is provided on this After Visit Summary.  MyChart is used to connect with patients for Virtual Visits (Telemedicine).  Patients are able to view lab/test results, encounter notes, upcoming appointments, etc.  Non-urgent messages can be sent to your provider as well.   To learn more about what you can do with MyChart, go to [email or web address].    Your next appointment:   6 month(s)  The format for your next appointment:   In Person  Provider:   Candee Furbish, MD      Important Information About Sugar

## 2022-07-18 ENCOUNTER — Encounter (HOSPITAL_COMMUNITY): Payer: Self-pay | Admitting: *Deleted

## 2022-07-24 ENCOUNTER — Other Ambulatory Visit (HOSPITAL_COMMUNITY): Payer: Self-pay | Admitting: Cardiology

## 2022-09-04 ENCOUNTER — Other Ambulatory Visit: Payer: Self-pay | Admitting: Cardiology

## 2022-09-04 DIAGNOSIS — I4819 Other persistent atrial fibrillation: Secondary | ICD-10-CM

## 2022-09-04 NOTE — Telephone Encounter (Signed)
Prescription refill request for Eliquis received. Indication: Afib  Last office visit: 06/19/22 Marlou Porch)  Scr: 0.93 (05/07/22)  Age: 77 Weight: 93.9kg  Appropriate dose. Refill sent.

## 2022-12-17 ENCOUNTER — Ambulatory Visit (HOSPITAL_BASED_OUTPATIENT_CLINIC_OR_DEPARTMENT_OTHER): Payer: Commercial Managed Care - PPO | Admitting: Cardiology

## 2022-12-17 ENCOUNTER — Encounter (HOSPITAL_BASED_OUTPATIENT_CLINIC_OR_DEPARTMENT_OTHER): Payer: Self-pay | Admitting: Cardiology

## 2022-12-17 VITALS — BP 174/75 | HR 70 | Ht 71.0 in | Wt 224.5 lb

## 2022-12-17 DIAGNOSIS — I10 Essential (primary) hypertension: Secondary | ICD-10-CM

## 2022-12-17 DIAGNOSIS — I480 Paroxysmal atrial fibrillation: Secondary | ICD-10-CM | POA: Diagnosis not present

## 2022-12-17 DIAGNOSIS — I700 Atherosclerosis of aorta: Secondary | ICD-10-CM | POA: Diagnosis not present

## 2022-12-17 DIAGNOSIS — I5042 Chronic combined systolic (congestive) and diastolic (congestive) heart failure: Secondary | ICD-10-CM | POA: Diagnosis not present

## 2022-12-17 DIAGNOSIS — I440 Atrioventricular block, first degree: Secondary | ICD-10-CM | POA: Diagnosis not present

## 2022-12-17 NOTE — Progress Notes (Signed)
  Cardiology Office Note:  .   Date:  12/17/2022  ID:  Joshua Mejia, DOB 09/13/1945, MRN [id number] PCP: [facility] Providers Cardiologist:  Donato Schultz, MD    History of Present Illness: .   Joshua Mejia is a 77 y.o. male here for follow-up chronic systolic and diastolic heart failure with EF 45% prior to DC cardioversion, EF now 55% post cardioversion on no spironolactone secondary to hyperkalemia Hyperlipidemia Hypertension Persistent atrial fibrillation-DC cardioversion 08/2019 asymptomatic Aortic atherosclerosis CAD-LAD calcification seen on CT 2/21 First-degree AV block  No major complaints no chest pain fevers chills nausea vomiting.  Active.  Still works at [employer].  He was upset about his blood pressure here today being in the 160s/170s.  At home every day it is when the 130s.  He feels well.  He takes his medications daily.   ROS: No fevers chills nausea vomiting syncope bleeding  Studies Reviewed: .        Echo 2021 EF 55% pulmonary pressures 26 mmHg, left atrial size moderately dilated mild aortic regurgitation  LDL 48 A1c 5.4 hemoglobin 40.9 creatinine 0.9 potassium 3.7 ALT 29 TSH 1.6  Risk Assessment/Calculations:          Physical Exam:   VS:  BP (!) 174/75 (BP Location: Right Arm, Patient Position: Sitting, Cuff Size: Large)   Pulse 70   Ht 5\' 11"  (1.803 m)   Wt 224 lb 8 oz (101.8 kg)   SpO2 97%   BMI 31.31 kg/m    Wt Readings from Last 3 Encounters:  12/17/22 224 lb 8 oz (101.8 kg)  06/19/22 207 lb (93.9 kg)  03/13/22 200 lb (90.7 kg)    GEN: Well nourished, well developed in no acute distress NECK: No JVD; No carotid bruits CARDIAC: RRR, no murmurs, rubs, gallops RESPIRATORY:  Clear to auscultation without rales, wheezing or rhonchi  ABDOMEN: Soft, non-tender, non-distended EXTREMITIES:  No edema; No deformity   ASSESSMENT AND PLAN: .   Chronic systolic and diastolic heart failure - NYHA  class I - EF most recently 55% excellent. - Continue with metoprolol 50 mg twice a day which also helps with rate control of atrial fibrillation. - No spironolactone because of prior hyperkalemia  Hyperkalemia - Occasional values-reluctance to use Entresto as well as spironolactone in the situation.  Hypertension - Doing well blood pressures in the 130s every day at home.  Trialed amlodipine.  Continue with chlorthalidone and Toprol.  Higher today.  Hyperlipidemia - LDL 48 with Zetia and rosuvastatin.  Has coronary artery disease/calcification.  Stabilizing plaque.  Hemoglobin A1c 5.4  Persistent atrial fibrillation - Has been in normal sinus rhythm first-degree AV block with PACs noted.  Secondary hypercoagulable state - On Eliquis twice daily excellent.  Hemoglobin has been stable.  Continue monitoring.  Aortic atherosclerosis - Seen previously on CT scan.  Statin.  No aspirin because of Eliquis.      Dispo: 1 year.  Signed, Donato Schultz, MD

## 2022-12-17 NOTE — Patient Instructions (Signed)
Medication Instructions:  The current medical regimen is effective;  continue present plan and medications.  *If you need a refill on your cardiac medications before your next appointment, please call your pharmacy*  Follow-Up: At [facility], you and your health needs are our priority.  As part of our continuing mission to provide you with exceptional heart care, we have created designated Provider Care Teams.  These Care Teams include your primary Cardiologist (physician) and Advanced Practice Providers (APPs -  Physician Assistants and Nurse Practitioners) who all work together to provide you with the care you need, when you need it.  We recommend signing up for the patient portal called "MyChart".  Sign up information is provided on this After Visit Summary.  MyChart is used to connect with patients for Virtual Visits (Telemedicine).  Patients are able to view lab/test results, encounter notes, upcoming appointments, etc.  Non-urgent messages can be sent to your provider as well.   To learn more about what you can do with MyChart, go to [email or web address].    Your next appointment:   1 year(s)  Provider:   Mark Skains, MD      

## 2023-01-17 ENCOUNTER — Other Ambulatory Visit (HOSPITAL_COMMUNITY): Payer: Self-pay | Admitting: Cardiology

## 2023-03-04 ENCOUNTER — Other Ambulatory Visit: Payer: Self-pay | Admitting: Cardiology

## 2023-03-04 DIAGNOSIS — I4819 Other persistent atrial fibrillation: Secondary | ICD-10-CM

## 2023-03-05 NOTE — Telephone Encounter (Signed)
Eliquis 5mg  refill request received. Patient is 77 years old, weight-101.8kg, Crea- 0.90 on 05/07/22, Diagnosis-Afib, and last seen by Dr. Anne Fu on 12/17/22. Dose is appropriate based on dosing criteria. Will send in refill to requested pharmacy.

## 2023-03-13 ENCOUNTER — Ambulatory Visit: Payer: Commercial Managed Care - PPO | Admitting: Cardiology

## 2023-04-17 ENCOUNTER — Other Ambulatory Visit: Payer: Self-pay | Admitting: Cardiology

## 2023-04-17 DIAGNOSIS — I10 Essential (primary) hypertension: Secondary | ICD-10-CM

## 2023-07-08 ENCOUNTER — Other Ambulatory Visit (HOSPITAL_COMMUNITY): Payer: Self-pay | Admitting: Cardiology

## 2023-07-08 DIAGNOSIS — I4819 Other persistent atrial fibrillation: Secondary | ICD-10-CM

## 2023-07-09 NOTE — Telephone Encounter (Addendum)
Prescription refill request for Eliquis received. Indication: Afib  Last office visit: 12/17/22 Anne Fu)  Scr: 0.90 (05/07/22)  Age: 78 Weight: 101.8kg  Labs overdue. Called PCP, confirmed labs have not been drawn since 2023. Called pt and made him aware of overdue labs. Pt states he can go to lab Monday; however, his last day at work is Friday and would like to get a refill before Friday while he has insurance. Refill sent. Pt states he will go to lab on Monday.

## 2023-08-08 ENCOUNTER — Telehealth: Payer: Self-pay | Admitting: Cardiology

## 2023-08-08 DIAGNOSIS — I10 Essential (primary) hypertension: Secondary | ICD-10-CM

## 2023-08-08 MED ORDER — CHLORTHALIDONE 25 MG PO TABS: 25.0000 mg | ORAL_TABLET | Freq: Every day | ORAL | 1 refills | Status: DC

## 2023-08-08 NOTE — Telephone Encounter (Signed)
 Pt aware to continue medication as ordered.  He is aware he is due for follow up with Dr Anne Fu in July and will need to schedule this appointment.

## 2023-08-08 NOTE — Telephone Encounter (Signed)
 Pt c/o medication issue:  1. Name of Medication: Chlorthalidone  2. How are you currently taking this medication (dosage and times per day)?   3. Are you having a reaction (difficulty breathing--STAT)?   4. What is your medication issue? Patient wants to know if Dr Anne Fu wants him to continue taking this medicine?  If so, he says he will need a refill called in please

## 2023-09-12 ENCOUNTER — Telehealth: Payer: Self-pay | Admitting: Licensed Clinical Social Worker

## 2023-09-12 ENCOUNTER — Telehealth: Payer: Self-pay | Admitting: Cardiology

## 2023-09-12 MED ORDER — APIXABAN 5 MG PO TABS: 5.0000 mg | ORAL_TABLET | Freq: Two times a day (BID) | ORAL | 0 refills | Status: DC

## 2023-09-12 NOTE — Telephone Encounter (Signed)
 H&V Care Navigation CSW Progress Note  Clinical Social Worker contacted patient by phone to f/u on lapse in insurance. Pt recently retired, his insurance was previously through employer per notes and chart review. Called but no voicemail at [phone]. If he calls back his Medicare coverage should start the month after he signed up- if he has questions or concerns SHIIP counselors through [employer] should be able to assist him at [phone] extension [phone]. Will re-attempt pt again.  Patient is participating in a Managed Medicaid Plan:  No, self pay only  SDOH Screenings   Tobacco Use: Low Risk  (12/17/2022)    Octavio Graves, MSW, LCSW Clinical Social Worker II [facility] Heart/Vascular Care Navigation  [phone]- work cell phone (preferred) [phone]- desk phone

## 2023-09-12 NOTE — Telephone Encounter (Signed)
 Pt c/o medication issue:  1. Name of Medication:   ELIQUIS 5 MG TABS tablet    2. How are you currently taking this medication (dosage and times per day)?    3. Are you having a reaction (difficulty breathing--STAT)? no  4. What is your medication issue? Medication is too expensive. Calling to see other options. Please advise

## 2023-09-12 NOTE — Telephone Encounter (Signed)
 Spoke with patient, recently lost insurance since no longer working and has applied for medicare but unsure when that will be active. Will provide patient assistance paperwork and 1 month of eliquis samples, placed up at front desk. Patient instructed to fill out and return patient assistance forms as soon as possible.

## 2023-09-12 NOTE — Telephone Encounter (Signed)
 Patient returned RN's call.

## 2023-09-12 NOTE — Telephone Encounter (Signed)
 Nothing for the Eliquis I am aware of except for the BMS application. Looping in social work as well to assist since pt is noninsured.

## 2023-09-12 NOTE — Telephone Encounter (Signed)
 Attempted to contact patient, unable to leave message. Voicemail box has not been set up.

## 2023-09-15 ENCOUNTER — Telehealth: Payer: Self-pay | Admitting: Licensed Clinical Social Worker

## 2023-09-15 NOTE — Telephone Encounter (Signed)
 H&V Care Navigation CSW Progress Note  Clinical Social Worker contacted patient by phone to f/u on referral for Medicare enrollment challenges and medication cost concerns. LCSW was able to reach pt this morning at [phone]. Introduced self, role, reason for call. Pt confirmed home address, PCP, and emergency contact is son Rom. Shares he stopped working in January and enrolled online for [employer] in March. Shares he received no additional information after that time- concerned about cost of Eliquis. LCSW shared that since we are unable to access pt information for Medicare enrollment that the best course of action would be to speak with a SHIIP counselor at [employer] regarding Medicare enrollment and to ensure pt completed it appropriately. He also has not started receiving any social security payments- again, recommended he speak with [facility] staff and Social Security respectively about concerns with benefits. Pt agreeable, took down both numbers. I also emailed Thayer Ohm, team lead with [employer] regarding pt questions and he let me know he would reach out to pt to discuss today.   Pt declined any additional assessment for SDOH needs at this time- I will f/u to ensure connections made and no additional questions arise. Pt advised his voicemail is not set up. He is aware, shares he doesn't need it to be. Encouraged him to consider setting it up given current assistance needs.   Patient is participating in a Managed Medicaid Plan:  No, self pay only, Medicare pending.   SDOH Screenings   Tobacco Use: Low Risk  (12/17/2022)    Octavio Graves, MSW, LCSW Clinical Social Worker II [facility] Heart/Vascular Care Navigation  [phone]- work cell phone (preferred) [phone]- desk phone

## 2023-09-18 ENCOUNTER — Telehealth: Payer: Self-pay | Admitting: Licensed Clinical Social Worker

## 2023-09-18 NOTE — Telephone Encounter (Signed)
 H&V Care Navigation CSW Progress Note  Clinical Social Worker contacted patient by phone to f/u on referral to [facility] program. No answer this morning x2 and voicemail not set up. Will re-attempt again as able.  Patient is participating in a Managed Medicaid Plan:  No, self pay as Medicare pending  SDOH Screenings   Tobacco Use: Low Risk  (12/17/2022)    Octavio Graves, MSW, LCSW Clinical Social Worker II [facility] Heart/Vascular Care Navigation  [phone]- work cell phone (preferred) [phone]- desk phone

## 2023-09-19 ENCOUNTER — Telehealth (HOSPITAL_BASED_OUTPATIENT_CLINIC_OR_DEPARTMENT_OTHER): Payer: Self-pay | Admitting: Licensed Clinical Social Worker

## 2023-09-19 NOTE — Telephone Encounter (Signed)
 H&V Care Navigation CSW Progress Note  Clinical Social Worker contacted patient by phone to f/u on referral to [facility] program. Was able to reach him today at [phone]. Re-introduced self, role, reason for call. Pt states he spoke with someone and they shared it would be around 4/9 that his Medicare should start- since he applied March 9th. He has still not received any card.   He did have a large deposit in his account this month on the 9th- he isnt sure if it is from social security or not. He gets discouraged by long wait time when he calls Social Security- hangs up and doesn't wait. Encouraged him to call and wait to try and speak with a representative, to be mindful about not calling numbers from text messages and instead use the government website number to contact them directly. Also provided Medicare number and encouraged him to reach out to [facility] again if still no updates over the next week or so.   Will f/u again in a week or so to check in. Encouraged him to call me also as needed.    Patient is participating in a Managed Medicaid Plan:  No, self pay only  SDOH Screenings   Tobacco Use: Low Risk  (12/17/2022)    Joshua Mejia, MSW, LCSW Clinical Social Worker II [facility] Heart/Vascular Care Navigation  [phone]- work cell phone (preferred) [phone]- desk phone

## 2023-09-29 ENCOUNTER — Telehealth (HOSPITAL_BASED_OUTPATIENT_CLINIC_OR_DEPARTMENT_OTHER): Payer: Self-pay | Admitting: Licensed Clinical Social Worker

## 2023-09-29 NOTE — Telephone Encounter (Signed)
 H&V Care Navigation CSW Progress Note  Clinical Social Worker contacted patient by phone to f/u on referral to [facility] program. No answer this morning and voicemail not set up. Will re-attempt again as able.   Patient is participating in a Managed Medicaid Plan:  No, self pay only  SDOH Screenings   Tobacco Use: Low Risk  (12/17/2022)    Nathen Balder, MSW, LCSW Clinical Social Worker II [facility] Heart/Vascular Care Navigation  [phone]- work cell phone (preferred) [phone]- desk phone

## 2023-10-03 ENCOUNTER — Telehealth (HOSPITAL_BASED_OUTPATIENT_CLINIC_OR_DEPARTMENT_OTHER): Payer: Self-pay | Admitting: Licensed Clinical Social Worker

## 2023-10-03 NOTE — Telephone Encounter (Signed)
 H&V Care Navigation CSW Progress Note  Clinical Social Worker contacted patient by phone to f/u on referral to [facility] program. No answer this morning and voicemail not set up. Remain available should pt returned missed calls, or if re-referred to care navigation team.   Patient is participating in a Managed Medicaid Plan:  No, self pay only  SDOH Screenings   Tobacco Use: Low Risk  (12/17/2022)    Nathen Balder, MSW, LCSW Clinical Social Worker II [facility] Heart/Vascular Care Navigation  [phone]- work cell phone (preferred) [phone]- desk phone

## 2023-10-07 ENCOUNTER — Telehealth: Payer: Self-pay

## 2023-10-07 ENCOUNTER — Other Ambulatory Visit (HOSPITAL_COMMUNITY): Payer: Self-pay

## 2023-10-07 NOTE — Telephone Encounter (Signed)
 Patient does not have RX coverage and unable to get it until window opens again in Oct. PAP application for (Eliquis /[employer]MS)  has been mailed to pt home. I will fax PCP pages once I receive pt pages

## 2023-10-07 NOTE — Telephone Encounter (Signed)
 Outreach to patient to follow up on Medicare status. Patient confirmed he is getting social security benefits and got a Medicare A&B card in the mail. Patient missed window for part D coverage and cannot request it again until Oct. 15th.

## 2023-10-31 NOTE — Telephone Encounter (Signed)
 Please have provider sign and date their portion of the Eliquis  application (see chart media). Please return the completed forms to [phone].

## 2023-11-04 NOTE — Telephone Encounter (Signed)
 Application signed and faxed back to [facility].

## 2023-11-04 NOTE — Telephone Encounter (Signed)
 Application printed and given to Dr Renna Cary for signature.

## 2023-11-08 ENCOUNTER — Other Ambulatory Visit: Payer: Self-pay | Admitting: Cardiology

## 2023-11-08 DIAGNOSIS — I10 Essential (primary) hypertension: Secondary | ICD-10-CM

## 2024-01-08 ENCOUNTER — Telehealth: Payer: Self-pay | Admitting: Cardiology

## 2024-01-08 MED ORDER — ROSUVASTATIN CALCIUM 20 MG PO TABS: 20.0000 mg | ORAL_TABLET | Freq: Every day | ORAL | 0 refills | Status: DC

## 2024-01-08 MED ORDER — EZETIMIBE 10 MG PO TABS: 10.0000 mg | ORAL_TABLET | Freq: Every day | ORAL | 0 refills | Status: DC

## 2024-01-08 NOTE — Telephone Encounter (Signed)
 Refills has been sent to the pharmacy.

## 2024-01-08 NOTE — Telephone Encounter (Signed)
 *  STAT* If patient is at the pharmacy, call can be transferred to refill team.   1. Which medications need to be refilled? (please list name of each medication and dose if known)   rosuvastatin  (CRESTOR ) 20 MG tablet  ezetimibe  (ZETIA ) 10 MG tablet   2. Which pharmacy/location (including street and city if local pharmacy) is medication to be sent to?  [id number] [address] [place], [place] - [address]   3. Do they need a 30 day or 90 day supply?  90 day  Patient has appt on 01/15/24 with [facility]

## 2024-01-15 ENCOUNTER — Other Ambulatory Visit (HOSPITAL_COMMUNITY): Payer: Self-pay

## 2024-01-15 ENCOUNTER — Ambulatory Visit: Payer: Self-pay | Attending: Cardiology | Admitting: Cardiology

## 2024-01-15 ENCOUNTER — Encounter: Payer: Self-pay | Admitting: Cardiology

## 2024-01-15 VITALS — BP 183/74 | HR 70 | Ht 71.0 in | Wt 220.0 lb

## 2024-01-15 DIAGNOSIS — E785 Hyperlipidemia, unspecified: Secondary | ICD-10-CM | POA: Diagnosis present

## 2024-01-15 DIAGNOSIS — Z79899 Other long term (current) drug therapy: Secondary | ICD-10-CM | POA: Diagnosis present

## 2024-01-15 DIAGNOSIS — I480 Paroxysmal atrial fibrillation: Secondary | ICD-10-CM | POA: Insufficient documentation

## 2024-01-15 DIAGNOSIS — I2510 Atherosclerotic heart disease of native coronary artery without angina pectoris: Secondary | ICD-10-CM | POA: Diagnosis not present

## 2024-01-15 DIAGNOSIS — I10 Essential (primary) hypertension: Secondary | ICD-10-CM | POA: Diagnosis present

## 2024-01-15 DIAGNOSIS — I700 Atherosclerosis of aorta: Secondary | ICD-10-CM | POA: Diagnosis not present

## 2024-01-15 DIAGNOSIS — I440 Atrioventricular block, first degree: Secondary | ICD-10-CM | POA: Insufficient documentation

## 2024-01-15 DIAGNOSIS — I5042 Chronic combined systolic (congestive) and diastolic (congestive) heart failure: Secondary | ICD-10-CM | POA: Insufficient documentation

## 2024-01-15 MED ORDER — CHLORTHALIDONE 25 MG PO TABS: 25.0000 mg | ORAL_TABLET | Freq: Every day | ORAL | 3 refills | Status: DC

## 2024-01-15 MED ORDER — EZETIMIBE 10 MG PO TABS: 10.0000 mg | ORAL_TABLET | Freq: Every day | ORAL | 3 refills | Status: DC

## 2024-01-15 MED ORDER — CHLORTHALIDONE 25 MG PO TABS
25.0000 mg | ORAL_TABLET | Freq: Every day | ORAL | 3 refills | Status: AC
  Filled 2024-01-15: qty 90, 90d supply, fill #0
  Filled 2024-07-16: qty 90, 90d supply, fill #1

## 2024-01-15 MED ORDER — ROSUVASTATIN CALCIUM 20 MG PO TABS
20.0000 mg | ORAL_TABLET | Freq: Every day | ORAL | 3 refills | Status: AC
  Filled 2024-01-15: qty 90, 90d supply, fill #0
  Filled 2024-07-16: qty 90, 90d supply, fill #1

## 2024-01-15 MED ORDER — ROSUVASTATIN CALCIUM 20 MG PO TABS: 20.0000 mg | ORAL_TABLET | Freq: Every day | ORAL | 3 refills | Status: DC

## 2024-01-15 MED ORDER — METOPROLOL SUCCINATE ER 50 MG PO TB24
50.0000 mg | ORAL_TABLET | Freq: Two times a day (BID) | ORAL | 3 refills | Status: AC
  Filled 2024-01-15: qty 180, 90d supply, fill #0
  Filled 2024-04-19: qty 180, 90d supply, fill #1
  Filled 2024-07-16: qty 180, 90d supply, fill #2

## 2024-01-15 MED ORDER — METOPROLOL SUCCINATE ER 50 MG PO TB24: 50.0000 mg | ORAL_TABLET | Freq: Two times a day (BID) | ORAL | 3 refills | Status: DC

## 2024-01-15 MED ORDER — EZETIMIBE 10 MG PO TABS
10.0000 mg | ORAL_TABLET | Freq: Every day | ORAL | 3 refills | Status: AC
  Filled 2024-01-15: qty 90, 90d supply, fill #0
  Filled 2024-07-16: qty 90, 90d supply, fill #1

## 2024-01-15 NOTE — Progress Notes (Signed)
 Cardiology Office Note:  .   Date:  01/15/2024  ID:  [name]., DOB 07-06-1945, MRN [id number] PCP: [facility] Providers Cardiologist:  Oneil Parchment, MD     History of Present Illness: .   Chief Walkup. is a 78 y.o. male Discussed the use of AI scribe software for clinical note transcription with the patient, who gave verbal consent to proceed.  History of Present Illness Joshua Mejia. is a 78 year old male with persistent atrial fibrillation and hypertension who presents for medication refills and follow-up.  He has a history of persistent atrial fibrillation and is currently on Eliquis  5 mg twice daily. He underwent DC cardioversion in 2021. No chest pain is reported, and he adheres to his medication regimen while maintaining an active lifestyle.  His hypertension is managed with chlorthalidone  25 mg daily and metoprolol  50 mg daily. He monitors his blood pressure at home, with readings typically in the 120s and occasionally in the 130s. A previous visit recorded a higher reading of 170, but such elevated readings have not been observed at home.  He is on rosuvastatin  20 mg and Zetia  10 mg for cholesterol management. His LDL was previously recorded at 48, and triglycerides at 39. He mentioned an issue with refills for these medications, as the pharmacy reported zero refills available.  His past medical history includes aortic atherosclerosis and LAD calcification seen on a CT scan in 2021. He previously had reduced pump function with an EF of 45% prior to cardioversion, and a subsequent echocardiogram in 2021 showed an EF of 55%. 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  No new symptoms are reported, and he feels fine overall. He is retired and stays at home, maintaining an active lifestyle.   Studies Reviewed: SABRA   EKG  Interpretation Date/Time:  Thursday January 15 2024 08:31:45 EDT Ventricular Rate:  70 PR Interval:  222 QRS Duration:  108 QT Interval:  386 QTC Calculation: 416 R Axis:   27  Text Interpretation: Sinus rhythm with 1st degree A-V block Incomplete left bundle branch block When compared with ECG of 07-May-2022 13:07, Premature ventricular complexes are no longer Present Nonspecific T wave abnormality now evident in Lateral leads Confirmed by Parchment Oneil ([id number]) on 01/15/2024 8:59:39 AM    Results LABS LDL: 48 Creatinine: 0.9 Triglycerides: 39 Hemoglobin: 13.3  RADIOLOGY CT scan: Aortic atherosclerosis  DIAGNOSTIC Echocardiogram: Normal pump function, EF 55%, grade 2 diastolic dysfunction (2021) EKG: Normal Risk Assessment/Calculations:           Physical Exam:   VS:  BP (!) 183/74   Pulse 70   Ht 5' 11 (1.803 m)   Wt 220 lb (99.8 kg)   SpO2 95%   BMI 30.68 kg/m    Wt Readings from Last 3 Encounters:  01/15/24 220 lb (99.8 kg)  12/17/22 224 lb 8 oz (101.8 kg)  06/19/22 207 lb (93.9 kg)    GEN: Well nourished, well developed in no acute distress NECK: No JVD; No carotid bruits CARDIAC: RRR, no murmurs, no rubs, no gallops RESPIRATORY:  Clear to auscultation without rales, wheezing or rhonchi  ABDOMEN: Soft, non-tender, non-distended EXTREMITIES:  No edema; No deformity   ASSESSMENT AND PLAN: .    Assessment and Plan Assessment & Plan Persistent atrial fibrillation Persistent atrial fibrillation managed with Eliquis  5 mg twice daily. EKG shows normal sinus rhythm with first-degree AV block.  Previous DC cardioversion in 2021 was successful in restoring normal sinus rhythm. - Continue Eliquis  5 mg twice daily.  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 No current symptoms of heart failure. Not on spironolactone  due to prior  hyperkalemia. - Avoid spironolactone  due to history of hyperkalemia.  Hypertension Hypertension managed with chlorthalidone  25 mg daily and metoprolol  succinate 50 mg twice daily. Home blood pressure readings are generally in the 120s to 130s. Office reading was slightly elevated, likely due to exertion. - Continue chlorthalidone  25 mg daily. - Continue metoprolol  succinate 50 mg twice daily. - Encourage regular home blood pressure monitoring.  Hyperlipidemia Hyperlipidemia managed with rosuvastatin  20 mg and Zetia  10 mg. LDL cholesterol is well-controlled at 48 mg/dL. Issues with prescription refills for rosuvastatin  and Zetia  were reported. - Refill prescriptions for rosuvastatin  20 mg and Zetia  10 mg. - Send prescriptions to the pharmacy on the first floor for convenience and potential cost savings.  Checking lab work, refilling medications       Dispo: 1 yr APP  Signed, Oneil Parchment, MD

## 2024-01-15 NOTE — Patient Instructions (Signed)
 Medication Instructions:  The current medical regimen is effective;  continue present plan and medications.  *If you need a refill on your cardiac medications before your next appointment, please call your pharmacy*  Lab Work: Please have blood work today (CBC,CMP and Lipid)  If you have labs (blood work) drawn today and your tests are completely normal, you will receive your results only by: MyChart Message (if you have MyChart) OR A paper copy in the mail If you have any lab test that is abnormal or we need to change your treatment, we will call you to review the results.  Follow-Up: At [facility], you and your health needs are our priority.  As part of our continuing mission to provide you with exceptional heart care, our providers are all part of one team.  This team includes your primary Cardiologist (physician) and Advanced Practice Providers or APPs (Physician Assistants and Nurse Practitioners) who all work together to provide you with the care you need, when you need it.  Your next appointment:   1 year(s)  Provider:   One of our Advanced Practice Providers (APPs): Morse Clause, PA-C  Lamarr Satterfield, NP Miriam Shams, NP  Olivia Pavy, PA-C Josefa Beauvais, NP  Leontine Salen, PA-C Orren Fabry, PA-C  [place], PA-C Ernest Dick, NP  Damien Braver, NP Jon Hails, PA-C  Waddell Donath, PA-C    Dayna Dunn, PA-C  Scott Weaver, PA-C Lum Louis, NP Katlyn West, NP Callie Goodrich, PA-C  Evan Williams, PA-C Sheng Haley, PA-C  Xika Zhao, NP Kathleen Johnson, PA-C       We recommend signing up for the patient portal called MyChart.  Sign up information is provided on this After Visit Summary.  MyChart is used to connect with patients for Virtual Visits (Telemedicine).  Patients are able to view lab/test results, encounter notes, upcoming appointments, etc.  Non-urgent messages can be sent to your provider as well.   To learn more about what you can do with  MyChart, go to [email or web address].

## 2024-01-16 ENCOUNTER — Telehealth: Payer: Self-pay

## 2024-01-16 ENCOUNTER — Ambulatory Visit: Payer: Self-pay

## 2024-01-16 ENCOUNTER — Telehealth: Payer: Self-pay | Admitting: Cardiology

## 2024-01-16 ENCOUNTER — Other Ambulatory Visit (HOSPITAL_COMMUNITY): Payer: Self-pay

## 2024-01-16 DIAGNOSIS — I480 Paroxysmal atrial fibrillation: Secondary | ICD-10-CM

## 2024-01-16 LAB — LIPID PANEL
Chol/HDL Ratio: 2.2 ratio (ref 0.0–5.0)
Cholesterol, Total: 127 mg/dL (ref 100–199)
HDL: 57 mg/dL (ref >39)
LDL Chol Calc (NIH): 56 mg/dL (ref 0–99)
Triglycerides: 67 mg/dL (ref 0–149)
VLDL Cholesterol Cal: 14 mg/dL (ref 5–40)

## 2024-01-16 MED ORDER — APIXABAN 5 MG PO TABS
5.0000 mg | ORAL_TABLET | Freq: Two times a day (BID) | ORAL | 0 refills | Status: DC
  Filled 2024-01-16: qty 60, 30d supply, fill #0
  Filled 2024-01-16: qty 120, 60d supply, fill #1

## 2024-01-16 NOTE — Telephone Encounter (Signed)
 Prescription refill request for Eliquis  received. Indication: Afib Last office visit:01/15/24 ([place])  Scr: lab results pending from 01/15/24 Age: 78 Weight: 99.8kg Refill sent.

## 2024-01-16 NOTE — Telephone Encounter (Signed)
*  STAT* If patient is at the pharmacy, call can be transferred to refill team.   1. Which medications need to be refilled? (please list name of each medication and dose if known)   apixaban  (ELIQUIS ) 5 MG TABS tablet     2. Would you like to learn more about the convenience, safety, & potential cost savings by using the [facility]? No   3. Are you open to using the [facility] (Type [facility]. ). No   4. Which pharmacy/location (including street and city if local pharmacy) is medication to be sent to? [place] [facility]     5. Do they need a 30 day or 90 day supply?  90 day  Pt has 1 tablet left

## 2024-01-16 NOTE — Telephone Encounter (Signed)
 Patient still unable to obtain RX benefits. Submitting PAP application.

## 2024-01-21 NOTE — Telephone Encounter (Signed)
 Will send this message to Dr. Jeffrie and covering RN to review and further advise on pts eliquis  regimen, being pt has been denied for pt assistance for this medication, per our Pharmacy assistance team.

## 2024-01-21 NOTE — Telephone Encounter (Signed)
 PAP: Patient has been denied for patient assistance by [employer] ([employer]) due to patient above income threshold for program  Letter has been sent to pt.

## 2024-01-23 NOTE — Telephone Encounter (Signed)
 I spoke to patient in regards to switching from Eliquis  to Coumadin and he informed me that he just picked up 3 months worth of Eliquis .  I told him that once he got down to a month supply, he should reach out to us  to discuss transition.  He verbalized understanding

## 2024-04-16 ENCOUNTER — Ambulatory Visit

## 2024-04-16 ENCOUNTER — Telehealth: Payer: Self-pay | Admitting: *Deleted

## 2024-04-16 ENCOUNTER — Other Ambulatory Visit (HOSPITAL_COMMUNITY): Payer: Self-pay

## 2024-04-16 DIAGNOSIS — I4819 Other persistent atrial fibrillation: Secondary | ICD-10-CM

## 2024-04-16 MED ORDER — WARFARIN SODIUM 5 MG PO TABS
5.0000 mg | ORAL_TABLET | Freq: Every day | ORAL | 0 refills | Status: DC
  Filled 2024-04-16: qty 30, 30d supply, fill #0

## 2024-04-16 NOTE — Addendum Note (Signed)
 Addended by: DIONISIO SENIOR B on: 04/16/2024 09:11 AM   Modules accepted: Orders

## 2024-04-16 NOTE — Telephone Encounter (Signed)
 Called patient since he is on the Anticoagulation Schedule today at 1030am and he does not have warfarin on his med list. On the first call he answered and kept saying hello and then hung up. There was no answer on the second try and the voicemail has not been set up.    Received a call from the Schedulers and the patient was on the phone. Spoke with patient and inquired if he had started warfarin and he confirmed he has not and he states that he told the people a couple days ago he needed an appointment to get started on warfarin and they set him up. He states he did not think he needed the appointment without taken the medication.   Advised since he has not started warfarin that we need to get him started on it today. He only has enough eliquis  for today advised to take the eliquis  as directed and start warfarin tonight at 4pm, he will not be able to do the 3 day overlap of both meds. He is aware that a 30 day supply will be sent in at this time to the [employer] [facility]. Also, he is aware that he does not need to come in for an appointment at 1030am today and appointment moved to next Friday at 1030am. He is aware he will receive thorough education at that appointment. He was thankful for the information.  Removed eliquis  off med list and sent in warfarin prescription.

## 2024-04-19 ENCOUNTER — Other Ambulatory Visit (HOSPITAL_COMMUNITY): Payer: Self-pay

## 2024-04-23 ENCOUNTER — Ambulatory Visit: Attending: Cardiovascular Disease

## 2024-04-23 ENCOUNTER — Other Ambulatory Visit: Payer: Self-pay

## 2024-04-23 DIAGNOSIS — I4819 Other persistent atrial fibrillation: Secondary | ICD-10-CM

## 2024-04-23 DIAGNOSIS — Z7901 Long term (current) use of anticoagulants: Secondary | ICD-10-CM | POA: Insufficient documentation

## 2024-04-23 DIAGNOSIS — I4891 Unspecified atrial fibrillation: Secondary | ICD-10-CM | POA: Insufficient documentation

## 2024-04-23 LAB — POCT INR: INR: 1.8 — AB (ref 2.0–3.0)

## 2024-04-23 NOTE — Progress Notes (Signed)
 INR 1.8 Please see anticoagulation encounter  Take 1.5 tablets today only then Increase to 1 tablet Daily, except 1.5 tablets every Wednesday.  INR in 1 week. [phone].  A full discussion of the nature of anticoagulants has been carried out.  A benefit risk analysis has been presented to the patient, so that they understand the justification for choosing anticoagulation at this time. The need for frequent and regular monitoring, precise dosage adjustment and compliance is stressed.  Side effects of potential bleeding are discussed.  The patient should avoid any OTC items containing aspirin or ibuprofen, and should avoid great swings in general diet.  Avoid alcohol consumption.  Call if any signs of abnormal bleeding.

## 2024-04-23 NOTE — Patient Instructions (Signed)
 Take 1.5 tablets today only then Increase to 1 tablet Daily, except 1.5 tablets every Wednesday.  INR in 1 week. [phone].  A full discussion of the nature of anticoagulants has been carried out.  A benefit risk analysis has been presented to the patient, so that they understand the justification for choosing anticoagulation at this time. The need for frequent and regular monitoring, precise dosage adjustment and compliance is stressed.  Side effects of potential bleeding are discussed.  The patient should avoid any OTC items containing aspirin or ibuprofen, and should avoid great swings in general diet.  Avoid alcohol consumption.  Call if any signs of abnormal bleeding.

## 2024-04-30 ENCOUNTER — Ambulatory Visit: Attending: Cardiology | Admitting: Pharmacist

## 2024-04-30 DIAGNOSIS — Z7901 Long term (current) use of anticoagulants: Secondary | ICD-10-CM | POA: Insufficient documentation

## 2024-04-30 DIAGNOSIS — I4819 Other persistent atrial fibrillation: Secondary | ICD-10-CM | POA: Insufficient documentation

## 2024-04-30 DIAGNOSIS — I4891 Unspecified atrial fibrillation: Secondary | ICD-10-CM | POA: Insufficient documentation

## 2024-04-30 LAB — POCT INR: INR: 2 (ref 2.0–3.0)

## 2024-04-30 NOTE — Progress Notes (Signed)
 Description   INR 2.0 Take 1.5 tablets every Wednesday and Saturday and 1 tablet all other days of the week INR in 1 week. [phone].

## 2024-04-30 NOTE — Patient Instructions (Signed)
 Description   INR 2.0 Take 1.5 tablets every Wednesday and Saturday and 1 tablet all other days of the week INR in 1 week. [phone].

## 2024-05-10 ENCOUNTER — Other Ambulatory Visit (HOSPITAL_COMMUNITY): Payer: Self-pay

## 2024-05-10 ENCOUNTER — Ambulatory Visit: Attending: Cardiology | Admitting: Pharmacist

## 2024-05-10 DIAGNOSIS — I4891 Unspecified atrial fibrillation: Secondary | ICD-10-CM | POA: Diagnosis present

## 2024-05-10 DIAGNOSIS — Z7901 Long term (current) use of anticoagulants: Secondary | ICD-10-CM | POA: Diagnosis present

## 2024-05-10 DIAGNOSIS — I4819 Other persistent atrial fibrillation: Secondary | ICD-10-CM | POA: Diagnosis present

## 2024-05-10 LAB — POCT INR: INR: 2.4 (ref 2.0–3.0)

## 2024-05-10 MED ORDER — WARFARIN SODIUM 5 MG PO TABS
5.0000 mg | ORAL_TABLET | Freq: Every day | ORAL | 0 refills | Status: DC
  Filled 2024-05-10: qty 60, 60d supply, fill #0

## 2024-05-10 NOTE — Patient Instructions (Addendum)
 Description   INR 2.4: Take 1.5 tablets every Wednesday and Saturday and 1 tablet all other days of the week INR in 1 week. [phone].

## 2024-05-10 NOTE — Progress Notes (Signed)
 Description   INR 2.4: Take 1.5 tablets every Wednesday and Saturday and 1 tablet all other days of the week INR in 1 week. [phone].

## 2024-05-19 ENCOUNTER — Ambulatory Visit: Attending: Cardiology

## 2024-05-19 DIAGNOSIS — I4891 Unspecified atrial fibrillation: Secondary | ICD-10-CM | POA: Diagnosis present

## 2024-05-19 DIAGNOSIS — I4819 Other persistent atrial fibrillation: Secondary | ICD-10-CM | POA: Insufficient documentation

## 2024-05-19 DIAGNOSIS — Z7901 Long term (current) use of anticoagulants: Secondary | ICD-10-CM | POA: Diagnosis present

## 2024-05-19 LAB — POCT INR: INR: 1.5 — AB (ref 2.0–3.0)

## 2024-05-19 NOTE — Patient Instructions (Signed)
 Description   INR-1.5; Today take a 1/2 tablet more and tomorrow take 1.5 tablets then continue taking taking 1 tablet daily except 1.5 tablets on every Wednesday and Saturday. INR in 1 week. [phone].

## 2024-05-19 NOTE — Progress Notes (Signed)
 Description   INR-1.5; Today take a 1/2 tablet more and tomorrow take 1.5 tablets then continue taking taking 1 tablet daily except 1.5 tablets on every Wednesday and Saturday. INR in 1 week. [phone].

## 2024-05-27 ENCOUNTER — Ambulatory Visit: Attending: Cardiology

## 2024-05-27 DIAGNOSIS — Z5181 Encounter for therapeutic drug level monitoring: Secondary | ICD-10-CM | POA: Diagnosis present

## 2024-05-27 DIAGNOSIS — I4819 Other persistent atrial fibrillation: Secondary | ICD-10-CM | POA: Insufficient documentation

## 2024-05-27 LAB — POCT INR: POC INR: 1.6

## 2024-05-27 NOTE — Patient Instructions (Addendum)
 Description   INR-1.6; Take 2 tablets of warfarin today. Then START taking warfarin 1.5 tablets daily except for 1 tablet on Monday, Wednesday and Friday. Recheck INR in 2 weeks. [phone].

## 2024-05-27 NOTE — Progress Notes (Signed)
 Lab Results  Component Value Date   INR 1.6 05/27/2024   INR 1.5 (A) 05/19/2024   INR 2.4 05/10/2024   Description   INR-1.6; Take 2 tablets of warfarin today. Then START taking warfarin 1.5 tablets daily except for 1 tablet on Monday, Wednesday and Friday. Recheck INR in 2 weeks. [phone].

## 2024-06-11 ENCOUNTER — Ambulatory Visit: Attending: Cardiology | Admitting: *Deleted

## 2024-06-11 DIAGNOSIS — I4891 Unspecified atrial fibrillation: Secondary | ICD-10-CM | POA: Diagnosis present

## 2024-06-11 DIAGNOSIS — I4819 Other persistent atrial fibrillation: Secondary | ICD-10-CM | POA: Diagnosis present

## 2024-06-11 DIAGNOSIS — Z7901 Long term (current) use of anticoagulants: Secondary | ICD-10-CM | POA: Diagnosis present

## 2024-06-11 LAB — POCT INR: INR: 1.9 — AB (ref 2.0–3.0)

## 2024-06-11 NOTE — Patient Instructions (Signed)
 Description   INR-1.9; Take 2 tablets of warfarin today then START taking warfarin 1.5 tablets daily except for 1 tablet on Monday and Friday. Recheck INR in 2 weeks. [phone].

## 2024-06-11 NOTE — Progress Notes (Signed)
 Description   INR-1.9; Take 2 tablets of warfarin today then START taking warfarin 1.5 tablets daily except for 1 tablet on Monday and Friday. Recheck INR in 2 weeks. [phone].

## 2024-06-25 ENCOUNTER — Ambulatory Visit: Admitting: Pharmacist

## 2024-06-25 ENCOUNTER — Other Ambulatory Visit (HOSPITAL_COMMUNITY): Payer: Self-pay

## 2024-06-25 DIAGNOSIS — Z7901 Long term (current) use of anticoagulants: Secondary | ICD-10-CM | POA: Insufficient documentation

## 2024-06-25 DIAGNOSIS — I4891 Unspecified atrial fibrillation: Secondary | ICD-10-CM | POA: Insufficient documentation

## 2024-06-25 DIAGNOSIS — I4819 Other persistent atrial fibrillation: Secondary | ICD-10-CM | POA: Diagnosis present

## 2024-06-25 LAB — POCT INR: INR: 3.2 — AB (ref 2.0–3.0)

## 2024-06-25 MED ORDER — WARFARIN SODIUM 5 MG PO TABS
ORAL_TABLET | ORAL | 0 refills | Status: AC
  Filled 2024-06-25: qty 60, 30d supply, fill #0

## 2024-06-25 NOTE — Progress Notes (Signed)
 Description   INR-3.2; Hold dose today and then continue  taking warfarin 1.5 tablets daily except for 1 tablet on Monday and Friday. Recheck INR in 2 weeks. [phone].

## 2024-06-25 NOTE — Patient Instructions (Signed)
 Description   INR-3.2; Hold dose today and then continue  taking warfarin 1.5 tablets daily except for 1 tablet on Monday and Friday. Recheck INR in 2 weeks. [phone].

## 2024-07-09 ENCOUNTER — Ambulatory Visit: Attending: Cardiology | Admitting: Pharmacist

## 2024-07-09 DIAGNOSIS — I4891 Unspecified atrial fibrillation: Secondary | ICD-10-CM | POA: Insufficient documentation

## 2024-07-09 DIAGNOSIS — Z7901 Long term (current) use of anticoagulants: Secondary | ICD-10-CM | POA: Diagnosis present

## 2024-07-09 DIAGNOSIS — I4819 Other persistent atrial fibrillation: Secondary | ICD-10-CM | POA: Insufficient documentation

## 2024-07-09 LAB — POCT INR: INR: 2.4 (ref 2.0–3.0)

## 2024-07-09 NOTE — Progress Notes (Signed)
 Description   INR-2.4; Continue  taking warfarin 1.5 tablets daily except for 1 tablet on Monday and Friday. Recheck INR in 4 weeks. [phone].

## 2024-07-09 NOTE — Patient Instructions (Addendum)
 Description   INR-2.4; Continue  taking warfarin 1.5 tablets daily except for 1 tablet on Monday and Friday. Recheck INR in 4 weeks. [phone].

## 2024-07-16 ENCOUNTER — Other Ambulatory Visit (HOSPITAL_COMMUNITY): Payer: Self-pay

## 2024-08-06 ENCOUNTER — Ambulatory Visit
